# Patient Record
Sex: Female | Born: 1955 | Hispanic: Yes | Marital: Married
Health system: Western US, Academic
[De-identification: ages and names within clinical notes are randomized; demographics above are authoritative.]

---

## 2020-07-11 ENCOUNTER — Inpatient Hospital Stay: Admit: 2020-11-21 | Payer: MEDICARE | Primary: Physician

## 2020-11-06 ENCOUNTER — Inpatient Hospital Stay: Admit: 2020-11-21 | Payer: MEDICARE | Primary: Physician

## 2020-11-16 NOTE — Telephone Encounter (Signed)
Ambulatory Nursing Order for Surgical Pathology (Outside slide read)    Follow order inclusion criteria embedded within order template, once completed RN may enter order using Within Role/Scope - No Cosign Required     [place]  Date of Service: 10/27/2020   Accession No.:  [id number]   Number of Slides:  Path sample received:  Comments:

## 2020-11-16 NOTE — Telephone Encounter (Signed)
The following pathology has been received and needs a path overread order please:    Provider: Ovidio Hanger  Referral dx: Endometrial cancer     [place]  Date of Service: 10/27/2020   Accession No.:  [id number]   Number of Slides:  Path sample received:  Comments:

## 2020-11-17 NOTE — Telephone Encounter (Signed)
Slides received and brought down to path

## 2020-11-20 MED ORDER — LOSARTAN 50 MG TABLET
50 | ORAL | Status: AC
Start: 2020-11-20 — End: ?

## 2020-11-20 MED ORDER — ATORVASTATIN 20 MG TABLET
20 | ORAL | 3.00 refills | 90.00000 days | Status: DC
Start: 2020-11-20 — End: 2020-12-28

## 2020-11-22 ENCOUNTER — Inpatient Hospital Stay: Admit: 2020-11-22 | Payer: MEDICARE | Primary: Physician

## 2020-11-22 ENCOUNTER — Ambulatory Visit: Admit: 2020-11-22 | Discharge: 2020-12-13 | Payer: MEDICARE | Attending: Gynecologic Oncology | Primary: Physician

## 2020-11-22 DIAGNOSIS — C55 Malignant neoplasm of uterus, part unspecified (CMS code): Secondary

## 2020-11-22 DIAGNOSIS — R7303 Prediabetes: Secondary

## 2020-11-22 DIAGNOSIS — C541 Malignant neoplasm of endometrium: Secondary | ICD-10-CM

## 2020-11-22 LAB — PROTHROMBIN TIME
INR: 1 (ref 0.9–1.2)
PT: 12.8 s (ref 11.6–15.0)

## 2020-11-22 LAB — BASIC METABOLIC PANEL (NA, K, CL, CO2, BUN, CR, GLU, CA)
Anion Gap: 8 (ref 4–14)
Calcium, total, Serum / Plasma: 9.1 mg/dL (ref 8.4–10.5)
Carbon Dioxide, Total: 25 mmol/L (ref 22–29)
Chloride, Serum / Plasma: 106 mmol/L (ref 101–110)
Creatinine: 0.66 mg/dL (ref 0.55–1.02)
Glucose, non-fasting: 94 mg/dL (ref 70–199)
Potassium, Serum / Plasma: 3.7 mmol/L (ref 3.5–5.0)
Sodium, Serum / Plasma: 139 mmol/L (ref 135–145)
Urea Nitrogen, Serum / Plasma: 10 mg/dL (ref 7–25)
eGFRcr: 97 mL/min/{1.73_m2} (ref >59)

## 2020-11-22 LAB — COMPLETE BLOOD COUNT WITH DIFFERENTIAL
Abs Basophils: 0.03 10*9/L (ref 0.00–0.10)
Abs Eosinophils: 0.13 10*9/L (ref 0.00–0.40)
Abs Imm Granulocytes: 0.01 10*9/L (ref <0.10)
Abs Lymphocytes: 3.15 10*9/L (ref 1.00–3.40)
Abs Monocytes: 0.33 10*9/L (ref 0.20–0.80)
Abs Neutrophils: 2.72 10*9/L (ref 1.80–6.80)
Hematocrit: 40.3 % (ref 36.0–46.0)
Hemoglobin: 13 g/dL (ref 12.0–15.5)
MCH: 27.4 pg (ref 26.0–34.0)
MCHC: 32.3 g/dL (ref 31.0–36.0)
MCV: 85 fL (ref 80–100)
Platelet Count: 241 10*9/L (ref 140–450)
RBC Count: 4.74 10*12/L (ref 4.00–5.20)
WBC Count: 6.4 10*9/L (ref 3.4–10.0)

## 2020-11-22 LAB — HEMOGLOBIN A1C: Hemoglobin A1c: 5.5 %{total} (ref 4.3–5.6)

## 2020-11-22 LAB — ACTIVATED PARTIAL THROMBOPLASTIN TIME: aPTT: 28.5 s (ref 21.6–30.8)

## 2020-11-22 MED ORDER — CHLORTHALIDONE 25 MG TABLET
25 | ORAL | Status: AC
Start: 2020-11-22 — End: ?

## 2020-11-22 NOTE — Progress Notes (Incomplete)
[facility] - New Patient    Referring Physician  Rowan Blase, Md  [address]  [place] [address]     Visit Type: Initial Outpatient Consultation    Subjective:      Patient Identification:  Jacqueline Mclaughlin is a 65 y.o. 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     History of Present Illness  8/21:   - First episode of PMB. TVUS and pap smear normal.   -TVUS: Normal endometrial thickness of 3 mm. Several intramural fibroids, largest 3.0 cm in the fundus. 1.4 cm simple right ovarian cyst.    12/21:   - TVUS and EMB normal    07/11/20:  - Presents to ED for intermittent abdominal pain and spotting since August 2021  -TVUS: subcentimeter lesion in cervical canal, endometrial thickness 6 mm, 2 small fibroids  -CEA is 0.5. CA125 is 16. Plactin normal.    07/19/20:   - Referred to [facility] for continued PMB and pelvic pain. Continued bleeding since August (lasts up to 5 days, light to medium flow, cramping). Found to have a cervical lesion on exam. EMB performed.    07/21/20:   - Endometrial biopsy: Extremely scant benign fragmented surface endometrial glandular epithelium. Benign endocervical glandular tissue with microglandular hyperplasia. No hyperplasia, dysplasia or malignancy.  - Endocervix, biopsy: Benign endocervical glandular tissue. Benign ectocervical squamous epithelium. No dysplasia    09/12/20:   - Continued intermittent bleeding. PCP recommended repeat US. Patient sought GYN input. Scheduled for Pelvic exam under anesthesia, cervical dilation, hysteroscopy, endocervical and endometrial curettage, possible hysteroscopic polypectomy and/or myomectomy    10/27/20:  - Pelvic EUA,  hysteroscopy D&C, polypectomy   - Uterus, endometrium, curettage:  Endometrioid adenocarcinoma, FIGO grade 1-2. Sections of the endometrium curettage and polypectomy demonstrate an adenocarcinoma with tubular, cribriform and focal solid (5-10%) growth pattern. The tumor cells show moderate pleomorphism with enlarged hyperchromatic nuclei and small to medium amount of eosinophilic cytoplasm. Fragments of benign endometrial polyps are also noted. Immunostains are performed on tissue block A2 as follows: P53: Wild-type pattern, ER: Positive, diffuse and strong, Napsin A: Mostly negative.     MLH1 (M1): Positive, intact nuclear staining  PMS2 (A16-4): Positive, intact nuclear staining  MSH2 (I144-3154): Negative, loss of nuclear staining  MSH6 (MG86): Positive, intact nuclear staining    Loss of nuclear staining in MSH2 only. This pattern is unusual, and may represent heterogeneity of tumor in the limited biopsy material. Repeat mismatch repair proteins testing in final excision is recommended to further rule out possible Lynch syndrome.    - Uterus, endocervix, curettage: mostly mucoid material. Scant benign endocervical glands.    11/06/20: [place] CT of chest abdomen pelvis w/ contrast, s/p hysteroscopy:  Unremarkable chest CT.Myomatous uterus with interval reduction in the size of the endometrial cavity. 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 [name]. Referred for ***    *** significant abdominopelvic pain. *** tolerating diet PO without n/v, denies early satiety. Denies bloating, weight loss. ***normal bowel and bladder habits. Denies dysuria, hematuria. Denies current abnormal vaginal discharge/bleeding ***.     Denies family history  of breast cancer, uterine cancer, ovarian cancer, cervical cancer, or colon cancer.***     In terms of her surgical candidacy, she has PMH  of HTN, HLD, pre-diabetes, GERD, hematuria, post-partum cardiomyopathy. Past surgical h/o of a cesarean section X2, ovarian cyst removal, and a tubal ligation. BMI 31.25. She lives independently *** walker/ wheelchair    Gyn history***  - Menarche:   - Menses:   - Menopause: ~66 yo  - Sexually active and partners:   - Birth control: none  - H/o STIs: none     Health care maintenance***  - Pap: 02/02/2020, NILM, HPV negative  - Mammogram: 10/03/2020, normal  - Colonoscopy:  - DEXA: never    Review of Systems  ROS    Past Medical History  Past Medical History:   Diagnosis Date    Bilateral presbyopia     Cervical polyp     Cortical cataract of both eyes     Depression     Essential hypertension     GERD (gastroesophageal reflux disease)     Hematuria     Hyperlipidemia     Postpartum cardiomyopathy     Pre-diabetes     Tension headache     Thyroid nodule        OB History     Gravida   7    Para   6    Term   5    Preterm   1    AB   1    Living   5       SAB        IAB        Ectopic        Molar        Multiple        Live Births                    Past Surgical History  Past Surgical History:   Procedure Laterality Date    CESAREAN SECTION  1990    CESAREAN SECTION  2001    HYSTEROSCOPY  10/27/2020    OVARIAN CYST REMOVAL      THYROID BIOPSY  11/13/2015    TUBAL LIGATION          Medications  Current Outpatient Medications   Medication Sig    atorvastatin (LIPITOR) 20 mg tablet Take 20 mg by mouth daily    losartan (COZAAR) 50 mg tablet Take 50 mg by mouth daily     No current facility-administered medications for this visit.        Allergies  Patient has no allergy information on record.     Family History  No family history on file.     Social History  Social History     Socioeconomic History    Marital status: Unknown/Declined   Tobacco Use    Smoking status: Never Smoker          Objective:      There were no vitals taken for this visit.     No flowsheet data found.    Physical  Exam  Physical Exam        Studies  Labs:   ***    No results found for: WBC, RBC, HGB, HCT, MCV, MCH, MCHC, PLT, CBCD  No results found for: NA, K, CL, CO2, BUN, CREAT, GLU  No results found for: ALT, AST, ALKP, DBILI, TBILI, ANA4, TBILWB, GGT, GGTEXL  No results found for:  AFETOPROTEIN, AFP, AFPTEXL, AFPTEXQ, C125, CA125EXT, C125EXL, C125EXQ, CA15, CA19, LABCA2, CA27EXL, CA27EXQ, HCGT, CEA, CEAEXL, CEAEXQ, CEAEXT, PSA, PSAS, PSAEXT, PSATOT, PSAU, INHNB    Imaging:   ***    Pathology:  ***      Assessment/Plan:       Ms. Jacqueline Mclaughlin is a 65 y.o. [phone] with ***    # ***  ***       The patient was seen and evaluated with Dr. [name] (attending physician) who led the discussion and formulated the plan.    Lauro Franklin, MD, (PGY-1)  Obstetrics, Gynecology & [facility] [place], [place]  11/20/20

## 2020-11-22 NOTE — H&P (Cosign Needed)
Gynecology Oncology  New Patient    Referring Physician  Rowan Blase, Md  [address]  [place] [address]     Subjective:      Patient Identification:  Jacqueline Mclaughlin is a 65 y.o. 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     History of Present Illness  8/21:   - First episode of PMB. TVUS and pap smear normal.   -TVUS: Normal endometrial thickness of 3 mm. Several intramural fibroids, largest 3.0 cm in the fundus. 1.4 cm simple right ovarian cyst.    12/21:   - TVUS and EMB normal    07/11/20:  - Presents to ED for intermittent abdominal pain and spotting since August 2021  -TVUS: subcentimeter lesion in cervical canal, endometrial thickness 6 mm, 2 small fibroids  -CEA is 0.5. CA125 is 16. Plactin normal.    07/19/20:   - Referred to [place] for continued PMB and pelvic pain. Continued bleeding since August (lasts up to 5 days, light to medium flow, cramping). Found to have a cervical lesion on exam. EMB performed.    07/21/20:   - Endometrial biopsy: Extremely scant benign fragmented surface endometrial glandular epithelium. Benign endocervical glandular tissue with microglandular hyperplasia. No hyperplasia, dysplasia or malignancy.  - Endocervix, biopsy: Benign endocervical glandular tissue. Benign ectocervical squamous epithelium. No dysplasia    09/12/20:   - Continued intermittent bleeding. PCP recommended repeat US. Patient sought GYN input. Scheduled for Pelvic exam under anesthesia, cervical dilation, hysteroscopy, endocervical and endometrial curettage, possible hysteroscopic polypectomy and/or myomectomy    10/27/20:  - Pelvic EUA,  hysteroscopy D&C, polypectomy   - Uterus, endometrium, curettage: Endometrioid adenocarcinoma, FIGO grade 1-2. Sections of  the endometrium curettage and polypectomy demonstrate an adenocarcinoma with tubular, cribriform and focal solid (5-10%) growth pattern. The tumor cells show moderate pleomorphism with enlarged hyperchromatic nuclei and small to medium amount of eosinophilic cytoplasm. Fragments of benign endometrial polyps are also noted. Immunostains are performed on tissue block A2 as follows: P53: Wild-type pattern, ER: Positive, diffuse and strong, Napsin A: Mostly negative.     MLH1 (M1): Positive, intact nuclear staining  PMS2 (A16-4): Positive, intact nuclear staining  MSH2 (X211-9417): Negative, loss of nuclear staining  MSH6 (EY81): Positive, intact nuclear staining    Loss of nuclear staining in MSH2 only. This pattern is unusual, and may represent heterogeneity of tumor in the limited biopsy material. Repeat mismatch repair proteins testing in final excision is recommended to further rule out possible Lynch syndrome.    - Uterus, endocervix, curettage: mostly mucoid material. Scant benign endocervical glands.    11/06/20: [place] CT of chest abdomen pelvis w/ contrast, s/p hysteroscopy:  Unremarkable chest CT.Myomatous uterus with interval reduction in the size of the endometrial cavity. No abnormal lymphadenopathy or peritoneal spread identified.Colonic diverticulosis    Today 11/22/20, patient presents for initial consultation with Dr. [name].     No more bleeding since 10/27/20 procedure. Denies significant abdominopelvic pain. Has lost some weight but intentional (weight 181lb in June). Having urinary urgency which is relatively new. No urinary frequency, dysuria, hematuria. Normal bowel habits.      Denies family history of breast cancer, uterine cancer, ovarian cancer, cervical cancer, or colon cancer. Mother stomach cancer,  maternal aunt ?pancreatic cancer.    In terms of her surgical candidacy, she has PMH of HTN, HLD, pre-diabetes, GERD, postpartum cardiomyopathy.  Past surgical h/o of a cesarean section x2 (1 midline incision and 1 low transverse incision), ?open ovarian cyst removal (unsure which side), postpartum tubal ligation. BMI 31.25. She lives with daughter in [place] (daughter is Therapist, sports). Ambulates and can walk up multiple flights of stairs without issue.    Ob/Gyn history  - Menopause: ~65 yo  - NSVD x3, C/S x2    Health care maintenance  - Pap:02/02/2020, NILM, HPV negative  - Mammogram: 10/03/2020, normal  - Colonoscopy: never (no FIT testing either)  - DEXA: never      Medical History     Diagnosis Date Comment Source    Bilateral presbyopia       Cervical polyp       Cortical cataract of both eyes       Depression       Essential hypertension       GERD (gastroesophageal reflux disease)       Hematuria       Hyperlipidemia       Postpartum cardiomyopathy       Pre-diabetes       Tension headache       Thyroid nodule             Surgical History     Procedure Laterality Date Comment Source    CESAREAN SECTION  1990      CESAREAN SECTION  2001      HYSTEROSCOPY  10/27/2020      OVARIAN CYST REMOVAL        THYROID BIOPSY  11/13/2015      TUBAL LIGATION                OB History     Gravida   7    Para   6    Term   5    Preterm   1    AB   1    Living   5       SAB        IAB        Ectopic        Molar        Multiple        Live Births                      Current Outpatient Medications:     atorvastatin (LIPITOR) 20 mg tablet, Take 20 mg by mouth daily, Disp: , Rfl:     chlorthalidone (HYGROTON) 25 mg tablet, Take 25 mg by mouth daily, Disp: , Rfl:     losartan (COZAAR) 50 mg tablet, Take 50 mg by mouth daily, Disp: , Rfl:     Allergies as of 11/22/2020  Review status set to Review Complete by Clent Ridges on 11/22/2020      Severity Noted Reaction Type Reactions    Enalapril High 10/23/2020    Cough          No family history on file.    Social History     Tobacco Use    Smoking status: Never Smoker    Smokeless tobacco: Not on file   Substance Use  Topics    Alcohol use: Not on file         Objective:     Vitals    11/22/20 7408  BP: (!) 143/51   Pulse: 70   Resp: 16   Temp: 36.4 C (97.5 F)   TempSrc: Temporal   SpO2: 99%   Weight: 75.3 kg (166 lb)   Height: 153.5 cm (5' 0.43")   PainSc:   2   PainLoc: Abdomen     BP (!) 143/51 Comment: pt reports no abnormal symptoms  Pulse 70   Temp 36.4 C (97.5 F) (Temporal)   Resp 16   Ht 153.5 cm (5' 0.43") Comment: 8/22 @ [facility] W.PT  Wt 75.3 kg (166 lb)   SpO2 99%   BMI 31.96 kg/m   Body mass index is 31.96 kg/m.      Physical Exam  Constitutional:       General: She is not in acute distress.     Appearance: Normal appearance.   Genitourinary:      Genitourinary Comments: Atrophic vaginal mucosa, normal appearing cervix without blood in vault. Bimanual and rectovaginal exam with normal sized uterus without any palpable masses or lesions.    Cardiovascular:      Rate and Rhythm: Normal rate.   Pulmonary:      Effort: Pulmonary effort is normal.   Abdominal:      General: There is no distension.      Palpations: Abdomen is soft.      Tenderness: There is no abdominal tenderness.   Musculoskeletal:      Cervical back: Neck supple.   Lymphadenopathy:      Cervical: No cervical adenopathy.   Neurological:      General: No focal deficit present.      Mental Status: She is alert and oriented to person, place, and time.   Skin:     General: Skin is warm and dry.   Psychiatric:         Mood and Affect: Mood normal.         Behavior: Behavior normal.           Labs:   Will obtain pre-op labs    Imaging:  CT C/A/P 11/06/20              Assessment:       Jacqueline Mclaughlin is a 65 y.o. [phone] with postmenopausal bleeding found to have FIGO grade 1-2 endometrioid adenocarcinoma of the uterus.       Plan:       -Stay one night after surgery (lives in [place])  -Will need post-op lovenox x14d  -OK for EUA with medical student  -Pre-op orders placed, including pre-op labs    We discussed that she is a candidate for a  minimally invasive procedure given uterine size and no evidence of distant disease. We discussed that minimally invasive surgery provides benefits including shorter time to recovery and fewer perioperative complications. However, if the surgery cannot be safely completed with a minimally invasive approach, we may have to convert to an open procedure although such conversions are rare.  As such, she was consented for a robotic assisted laparoscopic removal of uterus, cervix, ovaries, fallopian tubes, sentinel lymph node mapping and dissection, removal of abnormal tissue, exam under anesthesia, possible open procedure.    I have discussed with her the rationale, risks, benefits, and alternatives to the above named procedure. We discussed specific risks including the risks of infection, bleeding, possible transfusion with associated infectious and other risks.  We discussed the possible need for a blood transfusion with the concomitant risk of infection with HIV or hepatitis  C. She accepts blood transfusion if needed.    We discussed the risk of injury to the bowel, bladder, ureters, blood vessels, nerves, or other surrounding structures, which is usually identified and corrected at the time of the initial surgery, though could result in the need for additional procedures or surgeries during the same or a subsequent hospitalization. The risks of wound separation or infection necessitating packing which could take weeks to months were reviewed. We also discussed the risk of lymphedema which may be permanent in up to 10% of patients. We discussed the risk of a life threatening perioperative event such as a heart attack, stroke, thromboembolic event, or complication resulting in death. The patient and her family were reassured that we would do our best to minimize these risks. Surgical and hysterectomy consents were signed by the patient and witnessed.  Preoperative instructions were provided and all questions were  answered to their satisfaction.    Seen and discussed with Dr. [name], gyn/onc attending  Carlynn Herald MD, PGY-4  Resident Physician  Department of Obstetrics, Gynecology & Reproductive Sciences  11/22/2020

## 2020-12-01 NOTE — Telephone Encounter (Signed)
LVM for daughter to go over surgery details.

## 2020-12-01 NOTE — Telephone Encounter (Signed)
Call from patient asking to get more information about her surgery date as she needs to make sure her daughter will be available as well. Informed patient I will send message to Dr [name] [facility].  Spanish interp [place] id# [id number]

## 2020-12-22 NOTE — Telephone Encounter (Signed)
Called pt gave sx info and pre/post appointments. Interpreter [place] ID# [phone]

## 2020-12-28 ENCOUNTER — Ambulatory Visit: Payer: MEDICARE | Primary: Physician

## 2020-12-28 DIAGNOSIS — Z01818 Encounter for other preprocedural examination: Secondary

## 2020-12-28 MED ORDER — ATORVASTATIN 10 MG TABLET
10 | Freq: Every day | ORAL | 3.00 refills | 90.00000 days | Status: AC
Start: 2020-12-28 — End: ?

## 2020-12-28 MED ORDER — CHOLECALCIFEROL (VITAMIN D3) 25 MCG (1,000 UNIT) TABLET
1000 | Freq: Every day | ORAL | Status: AC
Start: 2020-12-28 — End: ?

## 2020-12-28 MED ORDER — OMEGA-3 FATTY ACIDS 1,000 MG CAPSULE
1000 | ORAL | Status: AC
Start: 2020-12-28 — End: ?

## 2020-12-28 NOTE — H&P (View-Only) (Signed)
[facility]  Anesthesia Preprocedure Evaluation    Procedure Information     Case: [id number] Date/Time: 01/05/21 0730    Procedure: XI ROBOTIC ASSISTED TOTAL HYSTERECTOMY;BILATERAL SALPINGO-OOPHORECTOMY WITH LYMPH NODE DISSECTION (N/A Uterus)    Diagnosis: Endometrial adenocarcinoma (CMS code) [C54.1]    Pre-op diagnosis: Endometrial adenocarcinoma (CMS code) [C54.1]    Location: [facility] OR 09 / [facility]    Surgeons: Wilma Flavin, MD          Precautions          None          Relevant Problems   No relevant active problems         Anesthesia Encounter History        CC/HPI/Past Medical History Summary: Katey Barrie is a 65 y.o. female with endometrial cancer s/f XI ROBOTIC ASSISTED TOTAL HYSTERECTOMY;BILATERAL SALPINGO-OOPHORECTOMY WITH LYMPH NODE DISSECTION (N/A Uterus) 01/05/21       (Please refer to APeX Allergies, Problems, Past Medical History, Past Surgical History, Social History, and Family History activities, Results for current data from these respective sections of the chart; these sections of the chart are also summarized in reports, including the Patient Summary Extracts found in Chart Review)      Summary of Outside Records:  ~~~~~~~~~~~~~~~~~~~~~~~~    Labs 01/03/21 in [place].    Notable for K 3.3     11/22/2020 10:58  Sodium: 139  Potassium: 3.7  Chloride: 106  CO2: 25  Anion Gap: 8  Glucose: 94  Urea Nitrogen, Serum / Plasma: 10  Creatinine: 0.66  eGFRcr: 97  Calcium: 9.1  WBC Count: 6.4  Hemoglobin: 13.0  Hematocrit: 40.3  RBC Count: 4.74  Platelet Count: 241  MCV: 85  MCH: 27.4  MCHC: 32.3  Abs Neutrophils: 2.72  Abs Lymphocytes: 3.15  Abs Monocytes: 0.33  Abs Eosinophils: 0.13  Abs Basophils: 0.03  Abs Imm Granulocytes: 0.01  PT: 12.8  INR: 1.0  aPTT: 28.5  Hemoglobin A1c: 5.5    ~~~~~~~~~~~~~~~~~~~~~~~~  Summary of Prior Anesthetics: Previous anesthetic with [facility]  Pt reports remote hx of prolonged sedation        Allergies as of 12/28/2020  Review status set to  Review Complete by Angelique Blonder, NP on 12/28/2020      Severity Noted Reaction Type Reactions    Enalapril High 10/23/2020    Cough        Prescription Medications as of 12/28/2020       Rx Number Disp Refills Start End Last Dispensed Date Next Fill Date [place]    atorvastatin (LIPITOR) 10 mg tablet        [place], [place]    Sig: Take 10 mg by mouth daily    Class: Historical Med    Route: Oral    chlorthalidone (HYGROTON) 25 mg tablet    07/24/2020    [place], [place]    Sig: Take 25 mg by mouth every morning    Class: Historical Med    Route: Oral    cholecalciferol, vitamin D3, 1000 UNITS tablet        [place]    Sig: Take 1,000 Units by mouth daily    Class: Historical Med    Route: Oral    losartan (COZAAR) 50 mg tablet  Sig: Take 50 mg by mouth every evening    Class: Historical Med    Route: Oral    omega-3 fatty acids 1,000 mg capsule        [place], [place]: Take 2 g by mouth daily    Class: Historical Med    Route: Oral            Review of Systems Functional Status: Climb a flight of stairs or walk up a hill (5.50 METs)   Constitutional: Negative for chills, fever and unexpected weight change.   Airway: Positive for dental hardware (permanent bridges ) and snoring. Negative for limited neck movement, difficulty opening mouth, loose teeth, witnessed apnea and CPAP  Respiratory: Negative for cough, Recent URI Symptoms and shortness of breath.    Cardiovascular: Negative for leg swelling, orthopnea, palpitations and chest pain.   Gastrointestinal: Positive for GERD Symptoms (occasional diet related .  no meds ).        Denies liver disease      Genitourinary: Negative for dysuria.        Denies kidney disease   Denies UTI symptoms      Musculoskeletal: Negative.    Skin: Negative for rash and wound.   Neurological:  Negative for syncope and seizures.   Hematological: Does not bruise/bleed easily.        Denies bleeding or clotting abnormality   + blood transfusion 1984      Ht 152.4 cm (5')   Wt 74.8 kg (165 lb)   BMI 32.22 kg/m     Physical Exam    Prepare (Pre-Operative Clinic) Assessment/Plan/Narrative  Prepare Clinic consult type: Telephone  CHG needed for pre-surgical prep, pt given instructions via AVS.  AVS mailed to patient     Interpreter: Available -  Name and/or ID: [id number]  Interpreter Source: Telephone Interpreter  Language: Spanish    T&S and K ordered STAT in pre-op       Obstructive Sleep Apnea Screening  STOPBANG Score:      S Do you Snore loudly (louder than talking or loud  enough to be heard through closed doors)? No    T Do you often feel Tired, fatigued, or sleepy during daytime? No    O Has anyone Observed you stop breathing during  your sleep? No    P Do you have or are you being treated for high blood Pressure? Yes    B BMI more than 35 KG/m^2? No    A Age over 53 years old? Yes    N Neck circumference > 16 inches (40cm)?     G Gender: Female? No    STOPBANG Total Score 2         Risk Level (based only on STOPBANG) Low - Incomplete                     Anesthesia Assessment and Plan  ASA 2       Anesthesia Plan  Invasive Monitors/Vascular Access: none    (See Anesthesia Record for attending attestation)    [Please note, smart link data included in this note may not reflect changes since note creation. Please see appropriate section of APeX for up-to-the minute information.]

## 2020-12-28 NOTE — Anesthesia Pre-Procedure Evaluation (Addendum)
[facility]  Anesthesia Preprocedure Evaluation    Procedure Information     Case: [id number] Date/Time: 01/05/21 0730    Procedure: XI ROBOTIC ASSISTED TOTAL HYSTERECTOMY;BILATERAL SALPINGO-OOPHORECTOMY WITH LYMPH NODE DISSECTION (N/A Uterus)    Diagnosis: Endometrial adenocarcinoma (CMS code) [C54.1]    Pre-op diagnosis: Endometrial adenocarcinoma (CMS code) [C54.1]    Location: [facility] OR 09 / [facility]    Surgeons: Wilma Flavin, MD          Precautions          None          Relevant Problems   No relevant active problems         Anesthesia Encounter History        CC/HPI/Past Medical History Summary: Jacqueline Mclaughlin is a 65 y.o. female with endometrial cancer s/f XI ROBOTIC ASSISTED TOTAL HYSTERECTOMY;BILATERAL SALPINGO-OOPHORECTOMY WITH LYMPH NODE DISSECTION (N/A Uterus) 01/05/21.    NPO confirmed    Cardiac function and exercise tolerance unclear - does chores around the house, takes walks outside. Patient says her heart feels "racing" after climbing stairs quickly, but denies light headedness or syncope/near-syncope. No LE edema. Occasionally feels short of breath when lying flat but relieved when turning on her side. Sleeps on 1-2 pillows.      (Please refer to APeX Allergies, Problems, Past Medical History, Past Surgical History, Social History, and Family History activities, Results for current data from these respective sections of the chart; these sections of the chart are also summarized in reports, including the Patient Summary Extracts found in Chart Review)      Summary of Outside Records:  ~~~~~~~~~~~~~~~~~~~~~~~~    Labs 01/03/21 in [place].    Notable for K 3.3     11/22/2020 10:58  Sodium: 139  Potassium: 3.7  Chloride: 106  CO2: 25  Anion Gap: 8  Glucose: 94  Urea Nitrogen, Serum / Plasma: 10  Creatinine: 0.66  eGFRcr: 97  Calcium: 9.1  WBC Count: 6.4  Hemoglobin: 13.0  Hematocrit: 40.3  RBC Count: 4.74  Platelet Count: 241  MCV: 85  MCH: 27.4  MCHC: 32.3  Abs  Neutrophils: 2.72  Abs Lymphocytes: 3.15  Abs Monocytes: 0.33  Abs Eosinophils: 0.13  Abs Basophils: 0.03  Abs Imm Granulocytes: 0.01  PT: 12.8  INR: 1.0  aPTT: 28.5  Hemoglobin A1c: 5.5    ~~~~~~~~~~~~~~~~~~~~~~~~  Summary of Prior Anesthetics: Previous anesthetic with [facility]  Slow to wake up ~1984 after pelvic tumor surgery (a couple of hours) - informed patient this was within range of normal.       Allergies as of 12/28/2020  Review status set to Review Complete by Angelique Blonder, NP on 12/28/2020      Severity Noted Reaction Type Reactions    Enalapril High 10/23/2020    Cough        Prescription Medications as of 12/28/2020       Rx Number Disp Refills Start End Last Dispensed Date Next Fill Date [place]    atorvastatin (LIPITOR) 10 mg tablet        [place], [place]: Take 10 mg by mouth daily    Class: Historical Med    Route: Oral    chlorthalidone (HYGROTON) 25 mg tablet    07/24/2020    [place]    [place]: Take 25 mg by mouth every  morning    Class: Historical Med    Route: Oral    cholecalciferol, vitamin D3, 1000 UNITS tablet        [place], [place]: Take 1,000 Units by mouth daily    Class: Historical Med    Route: Oral    losartan (COZAAR) 50 mg tablet            [place]: Take 50 mg by mouth every evening    Class: Historical Med    Route: Oral    omega-3 fatty acids 1,000 mg capsule        [place], [place]: Take 2 g by mouth daily    Class: Historical Med    Route: Oral            Review of Systems Functional Status: Climb a flight of stairs or walk up a hill (5.50 METs)   Constitutional: Negative for chills, fever and unexpected weight change.   Airway: Positive for dental hardware (permanent bridges ) and snoring. Negative for limited neck movement, difficulty opening mouth, loose teeth, witnessed apnea and  CPAP  Respiratory: Negative for cough, Recent URI Symptoms and shortness of breath.    Cardiovascular: Negative for leg swelling, orthopnea, palpitations and chest pain.   Gastrointestinal: Positive for GERD Symptoms (occasional diet related .  no meds ).        Denies liver disease      Genitourinary: Negative for dysuria.        Denies kidney disease   Denies UTI symptoms      Musculoskeletal: Negative.    Skin: Negative for rash and wound.   Neurological: Negative for syncope and seizures.   Hematological: Does not bruise/bleed easily.        Denies bleeding or clotting abnormality   + blood transfusion 1984      Ht 152.4 cm (5')   Wt 74.8 kg (165 lb)   BMI 32.22 kg/m     Physical Exam    Airway:   Modified Mallampati score: I. Thyromental distance: 4 cm - 6.5 cm. Mouth opening: good. Neck range of motion: full.   Constitutional:       General: She is not in acute distress.     Appearance: She is well-developed and well-nourished.   HENT:      Mouth/Throat:      Mouth: Oropharynx is clear and moist.      Dentition: Normal.     Eyes:      Conjunctiva/sclera: Conjunctivae normal.   Cardiovascular:      Rate and Rhythm: Normal rate and regular rhythm.      Pulses: Intact distal pulses.      Heart sounds: Normal heart sounds.   Pulmonary:      Effort: Pulmonary effort is normal.      Breath sounds: Normal breath sounds.   Musculoskeletal:      Cervical back: Normal range of motion. Neurological:   Level of consciousness: alert.      Dental: dentition is normal.          Prepare (Pre-Operative Clinic) Assessment/Plan/Narrative  Prepare Clinic consult type: Telephone  CHG needed for pre-surgical prep, pt given instructions via AVS.  AVS mailed to patient     Interpreter: Available -  Name and/or ID: [phone]  Interpreter Source: Telephone Interpreter  Language: Spanish    T&S and K  ordered STAT in pre-op       Obstructive Sleep Apnea Screening  STOPBANG Score:      S Do you Snore loudly (louder than talking or  loud  enough to be heard through closed doors)? No    T Do you often feel Tired, fatigued, or sleepy during daytime? No    O Has anyone Observed you stop breathing during  your sleep? No    P Do you have or are you being treated for high blood Pressure? Yes    B BMI more than 35 KG/m^2? No    A Age over 33 years old? Yes    N Neck circumference > 16 inches (40cm)?     G Gender: Female? No    STOPBANG Total Score 2         Risk Level (based only on STOPBANG) Low - Incomplete                     Anesthesia Assessment and Plan  Day Of Surgery Provider Chart Review:  NPO status verified  Medications reviewed  Allergies reviewed  Problem list reviewed  Anesthesia history reviewed  Pertinent labs reviewed  Consults reviewed    ASA 2       Anesthesia Plan  Anesthesia Type: general  Pre-medication: gabapentin  Induction Technique: IntraVenous  Invasive Monitors/Vascular Access: none  Airway Plan: oral ET tube  Other Techniques: processed EEG monitoring  Planned Recovery Location: PACU    Blood Product PreparationBlood Products Plan: N/A, minimal risk    Anesthesia Potential Complication Discussion  There is the possibility of rare but serious complications.    Informed Consent for Anesthesia  Consent obtained from patient    Risks, benefits and alternatives including those of invasive monitoring discussed. Increased risks (as above) discussed.  Questions invited and all answered.  Interpreter: Interpreter information below  Pauline Aus [phone] via computer  Consent granted for anesthetic plan    Quality Measure Documentation   Opioid Therapy Planned? Yes    (See Anesthesia Record for attending attestation)    [Please note, smart link data included in this note may not reflect changes since note creation. Please see appropriate section of APeX for up-to-the minute information.]

## 2020-12-28 NOTE — Patient Instructions (Signed)
Queremos que su prxima visita quirrgica sea lo ms segura y cmoda posible. Las siguientes instrucciones estn diseadas con el fin de prepararle para su hospitalizacin [place]. Por favor, lea y [place] instrucciones minuciosamente.    Lugar del procedimiento  Su procedimiento est programado para llevarse a cabo en [place], [place] 2nd floor Adult surgical waiting Room [phone]    [place] y Mellody Drown del procedimiento  Por favor, llegue a las 01/05/2021  at 5:30 am .     Your surgery is scheduled to start at 7:30 however you will need to arrive (Su ciruga est programada para comenzar a las 7:30 . Sin embargo, Warden/ranger a las ) 2 hours in advance of the scheduled surgical start time (2 horas antes de la hora programada para el inicio de la ciruga) in order to allow ample time for pre-surgical preparation (a fin de permitir que haya el tiempo suficiente para la preparacin prequirrgica).    Si por algn motivo se cambia el tiempo del inicio de su [place], usted recibir una llamada del consultorio se su cirujano. Si tiene alguna pregunta con respecto a la fecha o la hora de llegada, por favor llame al consultorio de su cirujano y pida hablar con su asistente.    Preparacin para la ciruga  Cundo debo dejar de comer?  Por favor, no coma ni beba nada despus de la medianoche, la noche antes de su ciruga (incluidos chicle, dulce o mentas).  Sin embargo, puede beber pequeos sorbos de agua, si se le indica que tome medicamentos, la maana de la [place].  Si come o bebe algo aparte de los pequeos sorbos de agua despus de la medianoche, la noche antes de su [place], esta se cancelar.      Qu medicamentos debo Administrator, arts de la ciruga?     Medication List            Accurate as of December 28, 2020  4:25 PM. Always use your most recent med list.                Informacin sobre [place] de la ciruga Instrucciones especiales  Other   atorvastatin 10 mg tablet  Take 10 mg by mouth daily  Commonly known as: LIPITOR        Continue       chlorthalidone 25 mg tablet  Take 25 mg by mouth every morning  Commonly known as: HYGROTON     Do not take morning of procedure           cholecalciferol (vitamin D3) 1000 UNITS tablet  Take 1,000 Units by mouth daily     Do not take morning of procedure           losartan 50 mg tablet  Take 50 mg by mouth every evening  Commonly known as: COZAAR     Do not take morning of procedure           omega-3 fatty acids 1,000 mg capsule  Take 2 g by mouth daily        Continue              Avoid Aspirin, Advil, Motrin, Ibuprofen, Aleve, Naproxen, Excedrin, meloxicam (Mobic), diclofenac (Voltaren), celecoxib (Celebrex), Fish Oil, Omega 3, glucosamine/chondroitin, and Vitamin E, ginkgo biloba, garlic, turmeric/curcumin, herbal medicines, and flax seeds oil seven days before surgery to prevent prolonged bleeding.  You  may take acetaminophen (Tylenol) if needed for pain        Preparacin para acudir al hospital  Por favor, dchese o bese la noche antes o la maana de la [place], ya sea con jabn antibacteriano Dial o con jabn de clorhexidina (Hibiclens). Por favor, lvese el cuerpo entero usando uno de estos dos jabones. Sin embargo, no aplique [place] de clorhexidina (Hibiclens) al rostro, ya que este puede causar grave dao a los ojos. Puede comprar estos jabones en la mayora de las farmacias (por [place], [place], [employer], [employer], [employer]).    Pngase ropa informal cmoda y [place], y deje en casa todas sus prendas de valor, incluidas joyas y altas sumas de [place].  Deje en casa los lentes de contacto. Pngase sus anteojos y traiga un estuche.  Si contrae alguna enfermedad antes de la [place] ([place], tos, garganta irritada, resfriado, gripe o infeccin), por favor llame a su cirujano y a los nmeros [facility] at [phone].  Si va a pasar la noche (en el  hospital) traiga sus artculos de higiene personal y ropa para dormir si lo desea. De lo contrario, el hospital se los Geneticist, molecular.   NO traiga sus medicamentos consigo al hospital, salvo que se le indique especficamente que lo haga.  TRAIGA una lista de sus medicamentos, incluidas las dosis y [employer] toma.  TRAIGA DOS formas de identificacin, incluida una identificacin con fotografa.    Al salir del hospital  Por favor, pregntele a su cirujano sobre su estada anticipada.  Se recomienda que todos los pacientes cuenten con una persona responsable en casa la primera noche despus de que se le d el alta del hospital.  TODOS los pacientes, incluidos los pacientes que se someten a [place] y se Lucianne Lei a [place], deben [employer] arreglos para que un adulto los transporte en auto/acompae a casa al darles el alta. Si estos arreglos no se hacen con anticipacin, a los pacientes que se irn a Engineer, agricultural de la operacin se les cancelar su procedimiento.    Familiares y amigos  Los familiares y amigos pueden Photographer en las reas de [place]. Los coordinadores de atencin a los pacientes estn disponibles en estas reas de espera de pacientes para proporcionarles informacin actualizada con respecto a la evolucin de los pacientes; las asignaciones a Ecologist hospital; y planificacin al dar el alta (para la [place]).    [place] 2nd floor Adult surgical waiting Room [phone]    We want your upcoming surgical visit to be as safe and comfortable as possible. The following instructions are designed to prepare you for your surgical hospitalization.  Please read and follow all instructions carefully.    Procedure Location  Your procedure is scheduled to take place at [facility], [place] 2nd floor Adult surgical waiting Room [phone]    Procedure Date and Time  Please arrive on 01/05/2021   at 5:30.       Your surgery is scheduled to start at 7:30 however you will need to arrive 2 hours in advance of the scheduled surgical start time in order to allow ample time for pre-surgical preparation.    If for any reason your surgery start time is changed, you will receive a call from your surgeons office.  Should you have any questions regarding the date or time of arrival, please  call your surgeons office and ask to speak with his/her practice assistant.    Preparing for Surgery  What can I eat or drink on the day of surgery?  Please do not have anything to eat or drink except clear liquids after midnight the evening before your surgery (including gum, candy or mints).  You may have clear liquids on the day of surgery up to 2 hours prior to arrival.  Clear liquids include:  Non-pulp, clear apple juice.  Tea with sugar or sweetener (NO milk, cream, or milk substitute)  Gatorade  Water  If you drink anything other than clear liquids on the day of surgery, or if you have ANYTHING to drink in the two hours prior to hospital arrival, then your doctors will cancel your surgery for your safety.  If you have been instructed to take any medications the day of surgery, take them with a small sip of water.    If your surgeon has given you additional instructions about what to eat or drink before the day of surgery, please follow those instructions as well.    Which medications should I take on the day of surgery?     Medication List            Accurate as of December 28, 2020  4:25 PM. Always use your most recent med list.                Medication Information        Take Morning of Surgery Special Instructions Other   atorvastatin 10 mg tablet  Take 10 mg by mouth daily  Commonly known as: LIPITOR        Continue       chlorthalidone 25 mg tablet  Take 25 mg by mouth every morning  Commonly known as: HYGROTON     Do not take morning of procedure           cholecalciferol (vitamin D3) 1000 UNITS tablet  Take 1,000 Units  by mouth daily     Do not take morning of procedure           losartan 50 mg tablet  Take 50 mg by mouth every evening  Commonly known as: COZAAR     Do not take morning of procedure           omega-3 fatty acids 1,000 mg capsule  Take 2 g by mouth daily      Stop now                Avoid Aspirin, Advil, Motrin, Ibuprofen, Aleve, Naproxen, Excedrin, meloxicam (Mobic), diclofenac (Voltaren), celecoxib (Celebrex), Fish Oil, Omega 3, glucosamine/chondroitin, and Vitamin E, ginkgo biloba, garlic, turmeric/curcumin, herbal medicines, and flax seeds oil seven days before surgery to prevent prolonged bleeding.  You may take acetaminophen (Tylenol) if needed for pain      Preparing to Come to the [facility] with Hibiclens (Chlorhexidine) soap or chlorhexidine cloth to prevent infections     Instructions:   You should shower with Hibiclens soap or chlorhexidine cloth at least two times before your surgery-on the night before surgery and the morning before your surgery.     How to shower with Hibiclens soap:   1.  Rinse your body with warm water.   2.  Wash your hair with regular shampoo.   Rinse your hair with water. If you are having neck surgery, use Hibiclens soap instead of your regular shampoo to wash your hair.  Rinse your hair with water.   3.  Wet a clean sponge. Turn off the water. Apply Hibiclens soap liberally.   4.  Firmly massage all areas: neck, arms, chest, back, abdomen, hips, groin, genitals (external only) and buttocks. Avoid using Hibiclens soap on your face. Clean your legs and feet and between your fingers and toes. Pay special attention to the site of your surgery and all surrounding skin. Ask for help to clean your back if you are having back surgery.   5.  Lather again before rinsing.   6.  Turn on the water and rinse Hibiclens off your body.   7.  Dry off with a clean towel.   8.  Don't apply lotions or powders.   9.  Use clean clothes and freshly laundered bed linens    How to wash with Ready  Prep CHG (Chlorhexidine Cloth)              1.  Rinse your body with warm water.         2.  Wash your hair with regular shampoo. Rinse your hair with water.        3.  Use one cloth to cleanse treatment area        4.  Vigorously scrub skin back and forth for three minutes        5.  Allow area to dry for one minute. Do not rinse        6.  Do not use on open wounds   7. See package details for more instructions    Important showering reminders:   Do not use any other soaps or body wash when using Hibiclens. Other soaps can block the Hibiclens benefits.   After showering, do not apply lotion, cream, powder, deodorant, or hair conditioner.   Do not shave or remove body hair. Shaving your face is generally fine. If you are having head surgery, however, ask your doctor whether you can shave.   Hibiclens is safe to use on minor wounds, rashes, burns, and over staples and stitches.   Allergic reactions are rare but may occur. If you have an allergic reaction, stop using Hibiclens and call your doctor if you have a skin irritation.   If you are allergic to Hibiclens, please follow the bathing instructions above using an over-the-counter regular soap instead of Hibiclens..   If your surgeon has given you additional instructions, please follow those.          Wear casual, loose fitting, comfortable clothing and leave all valuables, including jewelry, and large sums of cash at home.  Leave your valuables at home. Belongings that remain with you are your responsibility. [facility] is not liable for loss or damage. If valuables are brought to the hospital, they will be identified and recorded by our admitting team. All jewelry must be removed and left at home. If rings cannot be removed, we encourage you to have them removed by a jeweler prior to surgery to avoid damage or loss of ring.   is not responsible of lose or damaged jewelry. This policy protects the patient and prevents the items from being lost or damaged.   Leave  contact lenses at home. Wear your eyeglasses and bring a case.  If you develop any illness prior to surgery (fever, cough, sore throat, cold, flu, infection), OR START A NEW MEDICATION, please call your surgeon and the [facility] at [phone].  If you are spending the  night you may bring toiletries and sleeping clothes if you desire; otherwise the hospital will provide them for you.   DO NOT bring your medications with you to the hospital unless you were specifically instructed to do so.  DO bring a list of your medications including dose(s) and when you take them.  DO bring TWO forms of ID - including one ID with a photo.    Leaving the Hospital  Please ask your surgeon about your anticipated length of stay.   Please make sure your ride is available to pick you up by 12N on day of discharge  It is recommended that all patients have a responsible person at home the first night after discharge from the hospital.  ALL patients, including same day surgery patients, must arrange for an adult to drive/escort them home upon discharge.  Patients going home the same day of surgery will have their procedure cancelled if these arrangements are not made ahead of time.    Family and Friends  Friends and family may wait in the assigned waiting areas.  Patient care coordinators are available in these patient waiting areas to provide updates regarding patient progress; hospital room assignments; and discharge planning (for same day surgery).    [place], [place] 2nd floor Adult surgical waiting Room [phone]    Guided Imagery  Research has shown that listening to guided imagery is helpful for many health conditions.  [facility] offers these sessions to listen to at the following website: [email or web address].[email or web address]     Please consider guided imagery to prepare for surgery and for coping with stress, sleep and pain.      COVID Screening  On-Demand  Screening for COVID is required for all patients and visitors arriving to a [facility] facility. To expedite the screening process, you are welcome to utilize the [place] screening on-demand tool. Please follow the link from your smartphone or scan the QR code to open the screening tool on the day of your arrival and answer the screening questions prior to your arrival.     On-demand COVID screening: [email or web address]         Temporary Visitor Restrictions During the COVID-19 Pandemic:    Our Visitor Policies are guided by COVID-19 prevalence rate in the [employer] and in the [place]. We use three different levels of visitation. Before coming to the hospital for your surgery/procedure, please review the [place] Visitor Restrictions: [email or web address].[email or web address].  Visitor restrictions can vary by location, and may not be the same in Perioperative Care areas as they are in other parts of the hospital  If you are admitted to the hospital overnight visitors are approved by the inpatient Unit Director

## 2020-12-28 NOTE — Telephone Encounter (Signed)
Outpatient Oncology Social Work Note    Referral Reason: potential transportation needs    Data/Assessment:  SW referral received for potential transportation support. SW spoke with patient via phone with [place] interpreter [place] [id number]. Pt reported she was told that surgery on 10/7 was postponed to 10/17. SW informed patient team was attempting to schedule for 10/10. Pt reports her son is off of work on 10/7 and daughter is off on 10/17 given the information she received. Pt reports she has 2 options for transportation but just needs to inform them ahead of time given they work night shift. Pt agreeable to any date for surgery, but would like confirmation as soon as possible. SW empathized with patient re: multiple dates. SW to collaborate with PC Danae Chen re: surgery details. SW informed pt we would inform her of confirmed surgery details when they are known. Pt agreeable.     SW informed pt that she would need COVID test prior to surgery. Pt reported it is difficult to get one via PCP. SW informed pt likely  could schedule a test but transportation may be an issue. SW offered to screen patient for financial eligibility of CCS[employer] resources. Pt declined screening for CCSW resources at this time as she can arrange a ride with advanced notice with her family/friends. COVID test will need to be scheduled when surgery confirmed.     Pt disclosed feeling worried about surgery. SW provided empathy and support and inquired if she had questions for the team. Pt reported initially she was told she would not need chemotherapy if she had surgery. SW agreed to relay pt's question to medical team for them to address directly. SW sent msg to Dr. [name] RN team.     Pt agreeable to awaiting call from [facility] re: surgery scheudling. Patient has SW contact information for further follow up/needs. SW to remain available.     SW and PC [employer] discussed case. PC confirming surgery details and will reach out to pt directly re:  schedule.     Plan:  SW offered potential transportation resources; pt declined screening for [employer] resources.   Pt has 2 options for transportation with family/friends; needs advanced notice.  Pt with questions re: treatment plan; SW relayed to RN team.   SW provided pt with contact information. SW remains available for support or follow up needs.     Loleta Chance, [place], MPH 12/28/20  [phone]

## 2021-01-01 ENCOUNTER — Ambulatory Visit: Payer: MEDICARE | Attending: Registered Nurse | Primary: Physician

## 2021-01-01 DIAGNOSIS — Z719 Counseling, unspecified: Secondary

## 2021-01-01 NOTE — Patient Instructions (Addendum)
Verdis Frederickson,     Thank you for speaking with me today. Please see below the surgery items we went over.     [place],Rn

## 2021-01-01 NOTE — Progress Notes (Addendum)
Pre-Operative Education Tool Clinical biochemist) Patient Education:    Referral To:   No referral required   Education pamphlets given and explained: POET Minimally Invasive Gynecologic Oncology Surgery: A Patient's Guide    Post-op medications explained: Oxycodone, Tylenol, Ibuprofen, Lovenox and Senna    Necessary pre-op labs and tests explained and ordered.    Patient / Family Responses to Teaching:  Yes     Verbalizes understanding of information / instructions given: (if no, document action plan)   Yes     Other topics discussed:  Patient inquired if chemotherapy is needed after surgery.  Pt to get Covid test 01/02/21  [facility] [address]   P: [phone]    F: [phone]     Length of teaching: 1hr and 15 min       Reviewed with pt and/or family how to contact the office as needed for surgery scheduling and or clinical questions and concerns.

## 2021-01-03 NOTE — Telephone Encounter (Addendum)
TC to patient re: plan. LVM to call clinic back/     TC to patient /dtr FH:QRFX. Discussed with dtr about care plan and that this would have to wait for pathology results after surgery then Dr. [name] will discuss with patient next steps.     Dtr shared that patient attempted to get lab drawn prior to surgery but was unable to because there was no lab orders. Caller will reach to provider again.

## 2021-01-05 ENCOUNTER — Inpatient Hospital Stay
Admit: 2021-01-05 | Discharge: 2021-01-06 | Disposition: A | Discharge status: Home / Self Care | Payer: MEDICARE | Attending: Gynecologic Oncology | Admitting: Gynecologic Oncology

## 2021-01-05 DIAGNOSIS — C541 Malignant neoplasm of endometrium: Secondary | ICD-10-CM

## 2021-01-05 LAB — POTASSIUM, SERUM / PLASMA: Potassium, Serum / Plasma: 3.3 mmol/L — ABNORMAL LOW (ref 3.5–5.0)

## 2021-01-05 LAB — BLOOD TYPE CONFIRMATION (REQUIRES SEPARATE PHLEBOTOMY): ABO/RH(D): O POS

## 2021-01-05 LAB — TYPE AND SCREEN
ABO/RH(D): O POS
Antibody Screen: NEGATIVE

## 2021-01-05 MED ORDER — SENNOSIDES 8.6 MG TABLET
8.6 | ORAL_TABLET | Freq: Two times a day (BID) | ORAL | 1 refills | 30.00000 days | Status: AC
Start: 2021-01-05 — End: 2021-01-06

## 2021-01-05 MED ORDER — ROCURONIUM 10 MG/ML INTRAVENOUS SOLUTION
10 | INTRAVENOUS | Status: DC | PRN
Start: 2021-01-05 — End: 2021-01-05
  Administered 2021-01-05: 19:00:00 10 via INTRAVENOUS
  Administered 2021-01-05 (×2): 20 via INTRAVENOUS
  Administered 2021-01-05: 15:00:00 100 via INTRAVENOUS

## 2021-01-05 MED ORDER — ENOXAPARIN 30 MG/0.3 ML SUBCUTANEOUS SYRINGE: 30 mg/0.3 mL | SUBCUTANEOUS | Status: DC

## 2021-01-05 MED ORDER — BUPIVACAINE (PF) 0.5 % (5 MG/ML) INJECTION SOLUTION
5 | INTRAMUSCULAR | Status: AC
Start: 2021-01-05 — End: ?

## 2021-01-05 MED ORDER — HYDROMORPHONE (PF) 2 MG/ML INJECTION SOLUTION
2 | INTRAMUSCULAR | Status: AC
Start: 2021-01-05 — End: ?

## 2021-01-05 MED ORDER — HYDROMORPHONE (PF) 0.5 MG/0.5 ML INJECTION SYRINGE
0.5 | INTRAMUSCULAR | Status: DC | PRN
Start: 2021-01-05 — End: 2021-01-05

## 2021-01-05 MED ORDER — OXYCODONE 5 MG TABLET
5 | Freq: Once | ORAL | Status: DC | PRN
Start: 2021-01-05 — End: 2021-01-05

## 2021-01-05 MED ORDER — DEXAMETHASONE SODIUM PHOSPHATE 4 MG/ML INJECTION SOLUTION
4 | INTRAMUSCULAR | Status: AC
Start: 2021-01-05 — End: ?

## 2021-01-05 MED ORDER — METRONIDAZOLE 500 MG/100 ML IN SODIUM CHLOR(ISO) INTRAVENOUS PIGGYBACK
500 | INTRAVENOUS | Status: AC
Start: 2021-01-05 — End: ?

## 2021-01-05 MED ORDER — ONDANSETRON HCL 4 MG TABLET: 4 mg | ORAL | Status: AC | PRN

## 2021-01-05 MED ORDER — EPHEDRINE SULFATE 50 MG/ML INTRAVENOUS SOLUTION
50 | INTRAVENOUS | Status: AC
Start: 2021-01-05 — End: ?

## 2021-01-05 MED ORDER — DEXMEDETOMIDINE 200 MCG/50 ML (4 MCG/ML) IN 0.9 % SODIUM CHLORIDE IV
200 | INTRAVENOUS | Status: DC | PRN
Start: 2021-01-05 — End: 2021-01-05
  Administered 2021-01-05: 15:00:00 0 via INTRAVENOUS

## 2021-01-05 MED ORDER — CEFAZOLIN 1 GRAM SOLUTION FOR INJECTION
1 | INTRAMUSCULAR | Status: AC
Start: 2021-01-05 — End: ?

## 2021-01-05 MED ORDER — SUGAMMADEX 100 MG/ML INTRAVENOUS SOLUTION
100 | INTRAVENOUS | Status: AC
Start: 2021-01-05 — End: ?

## 2021-01-05 MED ORDER — HYDROMORPHONE 2 MG/ML INJECTION SOLUTION
2 | INTRAMUSCULAR | Status: DC | PRN
Start: 2021-01-05 — End: 2021-01-05
  Administered 2021-01-05 (×4): 0 via INTRAVENOUS

## 2021-01-05 MED ORDER — METRONIDAZOLE 500 MG/100 ML IN SODIUM CHLOR(ISO) INTRAVENOUS PIGGYBACK
500 | INTRAVENOUS | Status: DC | PRN
Start: 2021-01-05 — End: 2021-01-05
  Administered 2021-01-05: 16:00:00 500 via INTRAVENOUS

## 2021-01-05 MED ORDER — DEXAMETHASONE SODIUM PHOSPHATE 4 MG/ML INJECTION SOLUTION
4 | INTRAMUSCULAR | Status: DC | PRN
Start: 2021-01-05 — End: 2021-01-05
  Administered 2021-01-05: 15:00:00 8 via INTRAVENOUS

## 2021-01-05 MED ORDER — GABAPENTIN 300 MG CAPSULE: 300 mg | ORAL | Status: DC

## 2021-01-05 MED ORDER — PROPOFOL 10 MG/ML INTRAVENOUS EMULSION
10 | INTRAVENOUS | Status: DC | PRN
Start: 2021-01-05 — End: 2021-01-05
  Administered 2021-01-05 (×2): 50 via INTRAVENOUS
  Administered 2021-01-05: 15:00:00 100 via INTRAVENOUS

## 2021-01-05 MED ORDER — SODIUM CHLORIDE 0.9 % (FLUSH) INJECTION SYRINGE
0.9 % | INTRAMUSCULAR | Status: DC | PRN
Start: 2021-01-05 — End: 2021-01-06

## 2021-01-05 MED ORDER — MAGNESIUM SULFATE 1 GRAM/100 ML IN DEXTROSE 5 % INTRAVENOUS PIGGYBACK
1 | INTRAVENOUS | Status: DC | PRN
Start: 2021-01-05 — End: 2021-01-05
  Administered 2021-01-05: 15:00:00 1 via INTRAVENOUS

## 2021-01-05 MED ORDER — LIDOCAINE (PF) 10 MG/ML (1 %) INJECTION SOLUTION
10 | Freq: Once | INTRAMUSCULAR | Status: DC | PRN
Start: 2021-01-05 — End: 2021-01-05

## 2021-01-05 MED ORDER — ENOXAPARIN 40 MG/0.4 ML SUBCUTANEOUS SYRINGE
400.4 | Freq: Every day | SUBCUTANEOUS | Status: DC
Start: 2021-01-05 — End: 2021-01-06
  Administered 2021-01-06: 18:00:00 via SUBCUTANEOUS

## 2021-01-05 MED ORDER — GABAPENTIN 300 MG CAPSULE
300 | ORAL | Status: AC
Start: 2021-01-05 — End: 2021-01-05
  Administered 2021-01-05: 13:00:00 via ORAL

## 2021-01-05 MED ORDER — ENOXAPARIN 40 MG/0.4 ML SUBCUTANEOUS SYRINGE
40 | Freq: Every day | SUBCUTANEOUS | 0 refills | Status: DC
Start: 2021-01-05 — End: 2021-01-06

## 2021-01-05 MED ORDER — PROCHLORPERAZINE EDISYLATE 10 MG/2 ML (5 MG/ML) INJECTION SOLUTION
10 | Freq: Four times a day (QID) | INTRAMUSCULAR | Status: DC | PRN
Start: 2021-01-05 — End: 2021-01-05

## 2021-01-05 MED ORDER — HYDROMORPHONE (PF) 0.5 MG/0.5 ML INJECTION SYRINGE: 0.5 mg/0.5 mL | INTRAMUSCULAR | Status: AC | PRN

## 2021-01-05 MED ORDER — PROPOFOL 10 MG/ML INTRAVENOUS EMULSION
10 | INTRAVENOUS | Status: DC | PRN
Start: 2021-01-05 — End: 2021-01-05
  Administered 2021-01-05: 15:00:00 via INTRAVENOUS

## 2021-01-05 MED ORDER — FENTANYL (PF) 50 MCG/ML INJECTION SOLUTION
50 | INTRAMUSCULAR | Status: DC | PRN
Start: 2021-01-05 — End: 2021-01-05

## 2021-01-05 MED ORDER — CEFAZOLIN 1 GRAM SOLUTION FOR INJECTION
1 | INTRAMUSCULAR | Status: DC | PRN
Start: 2021-01-05 — End: 2021-01-05
  Administered 2021-01-05: 16:00:00 2000 via INTRAVENOUS

## 2021-01-05 MED ORDER — PHENYLEPHRINE 10 MG/ML INJECTION SOLUTION
10 | INTRAMUSCULAR | Status: DC | PRN
Start: 2021-01-05 — End: 2021-01-05
  Administered 2021-01-05: 16:00:00 100 via INTRAVENOUS

## 2021-01-05 MED ORDER — KETOROLAC 30 MG/ML (1 ML) INJECTION SOLUTION
30 | INTRAMUSCULAR | Status: DC | PRN
Start: 2021-01-05 — End: 2021-01-05
  Administered 2021-01-05: 19:00:00 15 via INTRAVENOUS

## 2021-01-05 MED ORDER — BUPIVACAINE (PF) 0.5 % (5 MG/ML) INJECTION SOLUTION
5 | INTRAMUSCULAR | Status: DC | PRN
Start: 2021-01-05 — End: 2021-01-05
  Administered 2021-01-05: 17:00:00 14 via INTRAMUSCULAR
  Administered 2021-01-05: 19:00:00 16 via INTRAMUSCULAR

## 2021-01-05 MED ORDER — PHENYLEPHRINE 1 MG/10 ML (100 MCG/ML) IN 0.9 % SOD.CHLORIDE IV SYRINGE
1 | INTRAVENOUS | Status: AC
Start: 2021-01-05 — End: ?

## 2021-01-05 MED ORDER — OXYCODONE 5 MG TABLET
5 | ORAL_TABLET | Freq: Three times a day (TID) | ORAL | 0 refills | 7.00000 days | Status: AC | PRN
Start: 2021-01-05 — End: 2021-01-06

## 2021-01-05 MED ORDER — EPHEDRINE SULFATE 50 MG/ML INTRAVENOUS SOLUTION
50 | INTRAVENOUS | Status: DC | PRN
Start: 2021-01-05 — End: 2021-01-05
  Administered 2021-01-05: 16:00:00 5 via INTRAVENOUS
  Administered 2021-01-05 (×2): 10 via INTRAVENOUS

## 2021-01-05 MED ORDER — DEXTROSE 5 % AND LACTATED RINGERS INTRAVENOUS SOLUTION
INTRAVENOUS | Status: AC
  Administered 2021-01-05: 20:00:00 via INTRAVENOUS
  Administered 2021-01-05: 22:00:00

## 2021-01-05 MED ORDER — ACETAMINOPHEN 500 MG GELCAP
500 | ORAL | Status: AC
Start: 2021-01-05 — End: 2021-01-05
  Administered 2021-01-05: 13:00:00 via ORAL

## 2021-01-05 MED ORDER — DEXMEDETOMIDINE 100 MCG/ML INTRAVENOUS SOLUTION
100 | INTRAVENOUS | Status: DC | PRN
Start: 2021-01-05 — End: 2021-01-05
  Administered 2021-01-05: 15:00:00 10 via INTRAVENOUS
  Administered 2021-01-05 (×2): 5 via INTRAVENOUS

## 2021-01-05 MED ORDER — ACETAMINOPHEN 500 MG TABLET
500 mg | ORAL | Status: DC
  Administered 2021-01-05: 22:00:00 via ORAL

## 2021-01-05 MED ORDER — ATORVASTATIN 10 MG TABLET
10 mg | ORAL | Status: AC
  Administered 2021-01-06: 03:00:00 via ORAL

## 2021-01-05 MED ORDER — LACTATED RINGERS INTRAVENOUS SOLUTION
INTRAVENOUS | Status: DC | PRN
Start: 2021-01-05 — End: 2021-01-05
  Administered 2021-01-05: 15:00:00 via INTRAVENOUS

## 2021-01-05 MED ORDER — SODIUM CHLORIDE 0.9 % (FLUSH) INJECTION SYRINGE
0.9 % | INTRAMUSCULAR | Status: AC
  Administered 2021-01-05 – 2021-01-06 (×3): via INTRAVENOUS

## 2021-01-05 MED ORDER — LOSARTAN 50 MG TABLET
50 mg | ORAL | Status: AC
  Administered 2021-01-06: 03:00:00 via ORAL

## 2021-01-05 MED ORDER — ACETAMINOPHEN 500 MG TABLET
500 | ORAL_TABLET | Freq: Three times a day (TID) | ORAL | 0 refills | 13.00000 days | Status: DC
Start: 2021-01-05 — End: 2021-01-06

## 2021-01-05 MED ORDER — ONDANSETRON HCL (PF) 4 MG/2 ML INJECTION SOLUTION
42 | Freq: Four times a day (QID) | INTRAMUSCULAR | Status: DC | PRN
Start: 2021-01-05 — End: 2021-01-06
  Administered 2021-01-05: via INTRAVENOUS

## 2021-01-05 MED ORDER — NALOXONE 0.4 MG/ML INJECTION SOLUTION: 0.4 mg/mL | INTRAMUSCULAR | Status: AC | PRN

## 2021-01-05 MED ORDER — GABAPENTIN 300 MG CAPSULE
300 | Freq: Once | ORAL | Status: AC
Start: 2021-01-05 — End: 2021-01-05

## 2021-01-05 MED ORDER — ROCURONIUM 10 MG/ML INTRAVENOUS SOLUTION
10 | INTRAVENOUS | Status: AC
Start: 2021-01-05 — End: ?

## 2021-01-05 MED ORDER — LIDOCAINE (PF) 100 MG/5 ML (2 %) INTRAVENOUS SYRINGE
100 | INTRAVENOUS | Status: DC | PRN
Start: 2021-01-05 — End: 2021-01-05
  Administered 2021-01-05: 15:00:00 100 via INTRAVENOUS

## 2021-01-05 MED ORDER — SENNOSIDES 8.6 MG TABLET
8.6 mg | ORAL | Status: AC
  Administered 2021-01-06: 03:00:00 via ORAL

## 2021-01-05 MED ORDER — SUGAMMADEX 100 MG/ML INTRAVENOUS SOLUTION
100 | INTRAVENOUS | Status: DC | PRN
Start: 2021-01-05 — End: 2021-01-05
  Administered 2021-01-05: 19:00:00 200 via INTRAVENOUS

## 2021-01-05 MED ORDER — INDOCYANINE GREEN 25 MG SOLUTION FOR INJECTION
25 | INTRAMUSCULAR | Status: DC | PRN
Start: 2021-01-05 — End: 2021-01-05
  Administered 2021-01-05: 16:00:00 5 via INTRAMUSCULAR

## 2021-01-05 MED ORDER — MENTHOL 5.8 MG LOZENGES
5.8 | Status: DC | PRN
Start: 2021-01-05 — End: 2021-01-05

## 2021-01-05 MED ORDER — PROCHLORPERAZINE EDISYLATE 10 MG/2 ML (5 MG/ML) INJECTION SOLUTION
10 mg/2 mL (5 mg/mL) | INTRAMUSCULAR | Status: AC | PRN
  Administered 2021-01-06: 05:00:00 via INTRAVENOUS

## 2021-01-05 MED ORDER — IBUPROFEN 600 MG TABLET
600 | ORAL_TABLET | Freq: Three times a day (TID) | ORAL | 0 refills | Status: AC | PRN
Start: 2021-01-05 — End: 2021-01-15

## 2021-01-05 MED ORDER — OXYCODONE 5 MG TABLET
5 mg | ORAL | Status: AC | PRN
  Administered 2021-01-06: 03:00:00 via ORAL

## 2021-01-05 MED ORDER — POLYETHYLENE GLYCOL 3350 17 GRAM ORAL POWDER PACKET: 17 gram | ORAL | Status: DC | PRN

## 2021-01-05 MED ORDER — LACTATED RINGERS INTRAVENOUS SOLUTION
INTRAVENOUS | Status: DC
Start: 2021-01-05 — End: 2021-01-05
  Administered 2021-01-05: 14:00:00 via INTRAVENOUS

## 2021-01-05 MED ORDER — DEXMEDETOMIDINE 100 MCG/ML INTRAVENOUS SOLUTION
100 | INTRAVENOUS | Status: AC
Start: 2021-01-05 — End: ?

## 2021-01-05 MED ORDER — PROPOFOL 10 MG/ML INTRAVENOUS EMULSION
10 | INTRAVENOUS | Status: AC
Start: 2021-01-05 — End: ?

## 2021-01-05 MED ORDER — ACETAMINOPHEN 500 MG GELCAP
500 | Freq: Once | ORAL | Status: AC
Start: 2021-01-05 — End: 2021-01-05

## 2021-01-05 MED ORDER — HEPARIN, PORCINE (PF) 5,000 UNIT/0.5 ML INJECTION SOLUTION
5000 | INTRAMUSCULAR | Status: DC | PRN
Start: 2021-01-05 — End: 2021-01-05
  Administered 2021-01-05: 16:00:00 5000 via SUBCUTANEOUS

## 2021-01-05 MED ORDER — ONDANSETRON HCL (PF) 4 MG/2 ML INJECTION SOLUTION
4 | INTRAMUSCULAR | Status: DC | PRN
Start: 2021-01-05 — End: 2021-01-05
  Administered 2021-01-05: 19:00:00 4 via INTRAVENOUS

## 2021-01-05 MED ORDER — SODIUM CHLORIDE 0.9 % INTRAVENOUS SOLUTION
0.9 | INTRAVENOUS | Status: DC | PRN
Start: 2021-01-05 — End: 2021-01-05
  Administered 2021-01-05: 15:00:00 via INTRAVENOUS

## 2021-01-05 MED ORDER — ACETAMINOPHEN 1,000 MG/100 ML (10 MG/ML) INTRAVENOUS SOLUTION (ONCE)
1000 | Freq: Once | INTRAVENOUS | Status: DC
Start: 2021-01-05 — End: 2021-01-05

## 2021-01-05 MED ORDER — MAGNESIUM SULFATE 4 GRAM/50 ML (8 %) IN WATER INTRAVENOUS PIGGYBACK
4 | INTRAVENOUS | Status: AC
Start: 2021-01-05 — End: ?

## 2021-01-05 MED ORDER — LIDOCAINE (PF) 20 MG/ML (2 %) INJECTION SOLUTION
20 | INTRAMUSCULAR | Status: AC
Start: 2021-01-05 — End: ?

## 2021-01-05 MED FILL — ROCURONIUM 10 MG/ML INTRAVENOUS SOLUTION: 10 mg/mL | INTRAVENOUS | Qty: 10

## 2021-01-05 MED FILL — PHENYLEPHRINE 1 MG/10 ML (100 MCG/ML) IN 0.9 % SOD.CHLORIDE IV SYRINGE: 1 mg/0 mL (00 mcg/mL) | INTRAVENOUS | Qty: 10

## 2021-01-05 MED FILL — XYLOCAINE-MPF 20 MG/ML (2 %) INJECTION SOLUTION: 20 mg/mL (2 %) | INTRAMUSCULAR | Qty: 5

## 2021-01-05 MED FILL — ACETAMINOPHEN 500 MG TABLET: 500 mg | ORAL | Qty: 2

## 2021-01-05 MED FILL — HYDROMORPHONE (PF) 2 MG/ML INJECTION SOLUTION: 2 mg/mL | INTRAMUSCULAR | Qty: 1

## 2021-01-05 MED FILL — METRONIDAZOLE 500 MG/100 ML IN SODIUM CHLOR(ISO) INTRAVENOUS PIGGYBACK: 500 mg/100 mL | INTRAVENOUS | Qty: 100

## 2021-01-05 MED FILL — BUPIVACAINE (PF) 0.5 % (5 MG/ML) INJECTION SOLUTION: 5 mg/mL (0. %) | INTRAMUSCULAR | Qty: 30

## 2021-01-05 MED FILL — MAGNESIUM SULFATE 4 GRAM/50 ML (8 %) IN WATER INTRAVENOUS PIGGYBACK: 4 g/50 mL | INTRAVENOUS | Qty: 50

## 2021-01-05 MED FILL — DEXMEDETOMIDINE 100 MCG/ML INTRAVENOUS SOLUTION: 100 mcg/mL | INTRAVENOUS | Qty: 2

## 2021-01-05 MED FILL — GABAPENTIN 300 MG CAPSULE: 300 mg | ORAL | Qty: 2

## 2021-01-05 MED FILL — DIPRIVAN 10 MG/ML INTRAVENOUS EMULSION: 10 mg/mL | INTRAVENOUS | Qty: 50

## 2021-01-05 MED FILL — ROCURONIUM 10 MG/ML INTRAVENOUS SOLUTION: 10 mg/mL | INTRAVENOUS | Qty: 5

## 2021-01-05 MED FILL — CEFAZOLIN 1 GRAM SOLUTION FOR INJECTION: 1 gram | INTRAMUSCULAR | Qty: 2000

## 2021-01-05 MED FILL — BRIDION 100 MG/ML INTRAVENOUS SOLUTION: 100 mg/mL | INTRAVENOUS | Qty: 2

## 2021-01-05 MED FILL — ONDANSETRON HCL (PF) 4 MG/2 ML INJECTION SOLUTION: 4 mg/2 mL | INTRAMUSCULAR | Qty: 2

## 2021-01-05 MED FILL — AKOVAZ 50 MG/ML INTRAVENOUS SOLUTION: 50 mg/mL | INTRAVENOUS | Qty: 1

## 2021-01-05 MED FILL — DEXAMETHASONE SODIUM PHOSPHATE 4 MG/ML INJECTION SOLUTION: 4 mg/mL | INTRAMUSCULAR | Qty: 5

## 2021-01-05 NOTE — Interval H&P Note (Signed)
Procedure(s) (LRB):  XI ROBOTIC ASSISTED TOTAL HYSTERECTOMY;BILATERAL SALPINGO-OOPHORECTOMY WITH LYMPH NODE DISSECTION (N/A)      ATTENDING SURGEON PREOPERATIVE NOTE  Jacqueline Mclaughlin is a 65 y.o. 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  I have performed an appropriate, condition-specific physical examination today, and the indication still exists for surgery.     There are no changes in the patients health history, review of systems, or physical findings since the previously recorded history and physical.    Physical exam done today:   Vital Sign Min/Max (last 24 hours)       Value Min Max    Temp 36.2 C (97.2 F) 36.2 C (97.2 F)    Pulse 73 73    *Resp 17 17    BP: Systolic 779 390    BP: Diastolic 69 69    SpO2 97 % 97 %          Gen: NAD  GU: Defer to OR    Key elements of latest CBC values... Please see Chart Review for additional result details.  Lab Results   Component Value Date    WBC Count 6.4 11/22/2020    Hemoglobin 13.0 11/22/2020    Hematocrit 40.3 11/22/2020    MCV 85 11/22/2020    Platelet Count 241 11/22/2020         DOCUMENTATION OF INFORMED CONSENT  I have discussed the risks, benefits, and alternatives of the procedure with the patient and/or the patient's medical decision-maker.  This discussion included, but was not limited to, the risk of bleeding, infection, damage to anatomical structures, need for reoperation, or even death.  The patient and/or the patient's medical decision maker understands, has had all of his/her questions answered, and desires to proceed.  Informed consent obtained.    MEDICAL  PROXY  She identifies Tramaine Snell [phone]) asher medical proxy.    Juliane Poot MD, PGY-3  Department of [place] [facility] [place], [place]

## 2021-01-05 NOTE — Anesthesia Post-Procedure Evaluation (Signed)
Anesthesia Post-op Evaluation    Scheduled date of Operation: 01/05/2021    Scheduled Surgeon(s):Edwin Nell Range, MDMary Elisabeth Cara, MDAlison Fontaine No, MD  Scheduled Procedure(s):XI ROBOTIC ASSISTED TOTAL HYSTERECTOMY;BILATERAL SALPINGO-OOPHORECTOMY WITH LYMPH NODE DISSECTION    Final Anesthesia Type: general    Assessment  Respiratory Function:      Airway Patency: Excellent      Respiratory Rate: See vitals below      SpO2: See vitals below      Overall Respiratory Assessment: Stable  Cardiovascular Function:      Pulse Rate: See Vitals Below      Blood Pressure: See Vitals Below      Cardiac status: Stable  Mental Status:      RASS Score: 0 Alert or calm  Temperature: Normothermic  Pain Control: Adequate  Nausea and Vomiting: Absent  Fluids/Hydration Status: Euvolemic    Complications (anesthesia/case associated complications, possible complications, and/or significant issues; as of time of note completion: No apparent complications      Plan  Follow-up care: As per primary team    Post-op Note Status: Complete, patient participated in evaluation, which occurred after recovery from anesthesia but prior to 48 hours from end of case                 Recent Pre-op and Post-op Vital Signs  Vitals:    01/05/21 1300 01/05/21 1315 01/05/21 1345   BP: 112/57 109/55 117/58   Pulse: 101 92 88   Resp: 17 13 17    Temp:      TempSrc:      SpO2: 96% 94% 95%     Last Vital Signs Out of Room to Anesthesia Stop  Vitals Value Taken Time   Pulse 93 01/05/21 1237   Resp 13 01/05/21 1237   SpO2 98 % 01/05/21 1237   BP 120/54 01/05/21 1237   Arterial Line BP (mmHg)      Arterial Line MAP (mmHg)     Arterial Line 2 BP (mmHg)     Arterial Line 2 MAP (mmHg)     Temp 36.4 C (97.5 F) 01/05/21 1235   Temp src Temporal 01/05/21 1235   Vitals shown include unvalidated device data.    Case Tracking Events:  Event Time In   In [place]   In [place]   In [place]   Push Notification to  Surgeon Duanne Guess) [phone]   Anesthesia Start 0746   Anesthesia Ready 0804   In [place]   Procedure Start 0843   PACU/Unit Notified 6283   Procedure Finish 6629   Extubation 4765   YYT of Room [place] 0354   In PACU 1239   Ready for Discharge 1321

## 2021-01-05 NOTE — Plan of Care (Signed)
Problem: Activity Intolerance - Abdominal / GI / GU Surgery Patient - Adult  Goal: Improved activity tolerance ( return to admit level or baseline )  Outcome: Progress within 12 hours  Goal: Able to perform physical activity as ordered  Outcome: Progress within 12 hours     Problem: Airway, Ineffective - Abdominal / GI / GU Surgery Patient - Adult  Goal: Patent airway / effective airway clearance  Outcome: Progress within 12 hours     Problem: Anxiety, Patient / Family / Caregiver - Abdominal / GI / GU Surgery Patient - Adult  Goal: Alleviation of anxiety (Patient / Family / Caregiver)  Outcome: Progress within 12 hours  Goal: Expression of calm and reduction in anxiety symptoms  Outcome: Progress within 12 hours     Problem: Bleeding, at Risk or Actual - Abdominal / GI / GU Surgery Patient - Adult  Goal: Absence of active bleeding  Outcome: Progress within 12 hours     Problem: Deep Venous Thrombosis, Risk of - Abdominal / GI / GU Surgery Patient - Adult  Goal: Absence of deep venous thrombosis  Outcome: Progress within 12 hours     Problem: Fluid Volume, Imbalanced - Abdominal / GI / GU Surgery Patient - Adult  Goal: Absence of fluid imbalance signs and symptoms  Outcome: Progress within 12 hours     Problem: Gas Exchange, Impaired - Abdominal / GI / GU Surgery Patient - Adult  Goal: Adequate oxygenation (absence of signs and symptoms of hypoxemia)  Outcome: Progress within 12 hours  Goal: Adequate ventilation (absence of signs and symptoms of hypercarbia)  Outcome: Progress within 12 hours     Problem: GI Elimination Impaired - Abdominal / GI / GU Surgery Patient - Adult  Goal: Passage of soft, formed stool  Outcome: Progress within 12 hours  Goal: Stool elimination per clinical condition ( e.g. GI ostomies )  Outcome: Progress within 12 hours     Problem: Glycemic Control, Abnormal - Abdominal / GI / GU Surgery Patient - Adult  Goal: Maintain glucose levels within specified parameters  Outcome: Progress within  12 hours     Problem: GU Elimination Impaired - Abdominal / GI / GU Surgery Patient - Adult  Goal: Able to void spontaneously  Outcome: Progress within 12 hours  Goal: Able to perform urinary self-catheterization  Outcome: Progress within 12 hours  Goal: Able to perform urinary catheter / ostomy care  Outcome: Progress within 12 hours  Goal: Adequate urine output and no signs and symptoms of obstruction  Outcome: Progress within 12 hours     Problem: Infection, at Risk and Actual - Abdominal / GI / GU Surgery Patient - Adult  Goal: Prevention of infection  Outcome: Progress within 12 hours  Goal: Resolution of Infection  Outcome: Progress within 12 hours     Problem: Mobility, Impaired - Abdominal / GI / GU Surgery Patient - Adult  Goal: Able to perform exercises as instructed  Outcome: Progress within 12 hours  Goal: Able to participate in ordered Rehab therapy (Patient / Family / Caregiver)  Outcome: Progress within 12 hours  Goal: Able to participate in repositioning and/or transferring (Patient / Family / Caregiver)  Outcome: Progress within 12 hours  Goal: Demonstrates safety with mobility / transfers  Outcome: Progress within 12 hours  Goal: Able to use ambulatory assistive device appropriately  Outcome: Progress within 12 hours  Goal: Able to propel wheelchair with assistance as needed  Outcome: Progress within 12 hours  Problem: Nausea/Vomiting - Abdominal / GI / GU Surgery Patient - Adult  Goal: Absence of nausea and vomiting  Outcome: Progress within 12 hours     Problem: Nutrition, Alteration in - Abdominal / GI / GU Surgery Patient - Adult  Goal: Adequate nutritional intake  Outcome: Progress within 12 hours  Goal: Maximize nutritional intake per patient condition  Outcome: Progress within 12 hours     Problem: Pain,  Acute / Chronic - Abdominal / GI / GU Surgery Patient - Adult  Goal: Control of chronic pain  Outcome: Progress within 12 hours  Goal: Control of acute pain  Outcome: Progress within 12  hours  Goal: Involvement in pain management plan  Outcome: Progress within 12 hours     Problem: Sexual Dysfunction - Abdominal / GI / GU Surgery Patient - Adult  Goal: Knowledge of anatomical and sexual changes  Outcome: Progress within 12 hours     Problem: Skin Integrity, Impaired - Abdominal / GI / GU Surgery Patient - Adult  Goal: Skin / incision / wound healing  Outcome: Progress within 12 hours     Problem: Sleep Disturbance, at Risk or Actual - Abdominal / GI / GU Surgery Patient - Adult  Goal: Optimal balance of rest and activity demonstrated or reported  Outcome: Progress within 12 hours     Problem: Thrombosis, Risk of Central / Mid Line Related - Abdominal / GI / GU Surgery Patient - Adult  Goal: Absence of thrombosis  Outcome: Progress within 12 hours

## 2021-01-05 NOTE — Progress Notes (Addendum)
[facility]  Progress Note - Gynecologic Oncology    ID:  65 y.o. 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 Pelvic washings. Uncomplicated. EBL 89ml. POD#0     Events  - POD#0      Subjective  Had some nausea and emesis while eating. Got zofran feels better. Ate more pineapple without issues. Has minimal abdominal pain. Not passing gas yet. Drinking water.     Continuous Meds   dextrose 5 % in lactated ringers 125 mL/hr (01/05/21 1302)       Scheduled Meds   0.9% sodium chloride flush  3 mL Intravenous Q8H [place]    acetaminophen  1,000 mg Oral Q8H [place]    atorvastatin  10 mg Oral Q PM [place]    [START ON 01/06/2021] enoxaparin  40 mg Subcutaneous Daily [place]    losartan  50 mg Oral Daily At Bedtime [facility]    senna  17.2 mg Oral Daily At Bedtime [facility]       PRN Meds   0.9% sodium chloride flush  3 mL Intravenous PRN    HYDROmorphone  0.2-0.8 mg Intravenous Q2H PRN    naloxone  0.1 mg Intravenous Q1 Min PRN    ondansetron  4 mg Oral Q6H PRN    Or    ondansetron  4 mg Intravenous Q6H PRN    oxyCODONE  5-10 mg Oral Q4H PRN    polyethylene glycol  17 g Oral Daily PRN       Ins/out    Intake/Output Summary (Last 24 hours) at 01/05/2021 1758  Last data filed at 01/05/2021 1705  Gross per 24 hour   Intake 2400.33 ml   Output 1350 ml   Net 1050.33 ml       I/O  Timeline      10/10 0701  10/11 0700 10/11 0701  10/12 0700 10/12 0701  10/13 0700 10/13 0701  10/14 0700 10/14 0701  10/15 0700    P.O.     400    I.V. (mL/kg)     2000.33 (26.74)    Total Intake(mL/kg)     2400.33 (32.09)    Urine (mL/kg/hr)     1325 (1.62)    Emesis     0    Stool     0    Blood     25    Total Output(mL/kg)     1350 (18.05)    Net     +1050.33             Unmeasured Stool Occurrence     0 x    Unmeasured Emesis Occurrence     1 x          PO: 400   UOP: 112 cc/hr last 12 hours     Vitals  Vitals    01/05/21 1345 01/05/21 1425 01/05/21 1529 01/05/21 1533   BP: 117/58 116/40 126/59 102/44   BP Location:  Right  upper arm Right upper arm Right upper arm   Patient Position: Lying Lying Lying Sitting   Pulse: 88 96 91 95   Resp: 17 16 16 16    Temp:  36.6 C (97.9 F)  36.6 C (97.9 F)   TempSrc:  Oral  Oral   SpO2: 95% 95%  96%   Weight:  74.8 kg (165 lb)     Height:              General: Well appearing. No acute distress.  CV: Regular rate. Normal peripheral perfusion.  Respiratory: Normal work of breathing  Abdomen: Soft. Mild tenderness to palpation.  Skin: Port site incisions c/d/i with dressing in place.  GU:  No vaginal bleeding.   Extremities: No peripheral edema, SCD in place  Neuro: Alert, oriented to person, place and time      Summary of Labs/Studies  Recent Labs     01/05/21  0616   K 3.3*        Imaging:  Radiology Results (last week)     ** No results found for the last 168 hours. **           DVT prophylaxis?  - Lovenox for 14 days starting 10/15 @ 0600     Assessment & Plan  65 y.o. 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 Pelvic washings. Uncomplicated. EBL 61ml. POD#0     Onc:  Abd pain minimal at this time.    - Pain: Well controlled on tylenol and ibuprofen.   - Intake:  Tolerating regular diet   - Output:    - Mobility: Ambulate/OOB TID as tolerated  - DVT PPx:  Lovenox 40mg  every day    - Path: Will f/u final path with Dr. [name] on 11/12      Heme/CV:  Stable. No signs/symptoms of active bleeding.  Pre-op Hgb 13.4 --> EBL 25cc --> POD1 Hgb []    - HTN: Hold PTA Losartan 50mg  at bedtime if BP <110, and Chlorthalidone 25mg  at bedtime   - HLD: continue PTA Atorvastatin 10mg  every day  []  AM CBC  []  AM BMP     GU:  Foley out  []  needs to void      GI:  Denies nausea.   - Zofran ordered prn  - Regular diet   - F/u lytes now and replete prn      Respiratory:  No active issues   - Encourage ICS     ID:  No active issues.     Dispo: Home tomorrow     Inpatient Checklist   Code status: Full   Isolation precautions: Not  indicated   Monitoring: None indicated   Diet: Regular    DVT prophylaxis: Chemical and SCDs   Bowel regimen: Standing   Delirium bundle: Not indicated   Disposition: Home when meeting all postoperative goals       Patient is currently being treated for the following conditions:  - Post op     Code Status: Full     Melba Coon, MD PGY-1  Obstetrics, Gynecology & [place] [facility] [place]  01/05/21

## 2021-01-05 NOTE — Plan of Care (Signed)
Problem: Anxiety - Perianesthesia Care  Goal: Minimize anxiety  Outcome: Adequate for Discharge     Problem: Pain Acute / Chronic - Perianesthesia Care  Goal: Control of acute pain  Outcome: Adequate for Discharge

## 2021-01-05 NOTE — Brief Op Note (Signed)
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]    Procedure(s) and Anesthesia Type:     * XI ROBOTIC ASSISTED TOTAL HYSTERECTOMY;BILATERAL SALPINGO-OOPHORECTOMY  - Anes-General    Implants: * No implants in log *     ID Type Source Tests Collected by Time Destination   1 : pelvic washings Other Other(specify) (Pathology) CYTOLOGY, NON-GYNECOLOGIC (DELIVER SPECIMEN TO PATHOLOGY) Kerin Perna, RN 14-Jan-2021 [phone]    2 : uterus, cervix, bilateral fallopian tubes and ovaries(frozen) Other Other(specify) (Pathology) [place], RN January 14, 2021 1109           Findings: globular uterus with leiomyoma, dense adhesions from omentum to anterior abdominal wall and from uterus to bladder and anterior abdominal wall. Normal appearing ovaries, appendix, liver surface.     Drains: urine foley to gravity    Complications: none    Disposition and Plan: extubated and transferred to PACU    Estimated Blood Loss: 25 mL      Was VTE prophylaxis ordered? Yes     Medication ordered: Heparin    Were NSAIDS ordered? Yes    Surgeon Attendance: I was scrubbed, present and performed the surgery as described.     Esbeydi Manago A. [name], MD  Provider number [phone]  Professor, [place] [facility] [place], [place]

## 2021-01-05 NOTE — Op Note (Signed)
OPERATIVE REPORT    DATE OF OPERATION:  01/05/2021    PREOPERATIVE DIAGNOSIS:  Endometrial adenocarcinoma.    POSTOPERATIVE DIAGNOSIS:  Endometrial adenocarcinoma.    OPERATION:  Xi robotic-assisted total laparoscopic hysterectomy,   bilateral salpingo-oophorectomy.    ANESTHESIA:  General endotracheal.    CLINICAL INDICATIONS:    FINDINGS:  Globular uterus with leiomyoma.  Dense adhesions from the   omentum to anterior abdominal wall and from the uterus to bladder and   from the uterus to the anterior abdominal wall.  Dissection took   greater than an hour, adding difficulty to the case.    Normal-appearing ovaries, appendix, and liver surface.  Pathology, on   gross inspection, there was a very small, less than 0.5 cm tumor with   minimal invasion.    PROCEDURE:  The patient was taken to the operating room and placed in   the dorsal supine position.  She was then placed in the low lithotomy   in the [place] after general anesthesia was administered.  A   Foley catheter was placed under sterile conditions.  ICG was injected   into the cervix in the usual fashion at 3 and 9, a total of 4 mL was   used, 25 mg in 20 mL of water solution.  The uterine manipulator was   placed in the cervix and sutured into place.  We made a left upper   quadrant incision.  A Veress needle was inserted into the abdomen and   shown to be intraperitoneal in the usual fashion.  We placed a   Visiport directly into that as a robotic accessory Visiport directly   into the left upper quadrant with no internal abdominal injuries   noted.  We then placed a midline 8 mm robotic accessory port and 2   right upper quadrant 8 mm accessory ports, and then a left upper   quadrant 12 mm assistant port. These were placed under direct   visualization and all the incisions were infiltrated with 0.25%   Marcaine before incision.  We put the patient in Trendelenburg. We   took down the adhesions from the omentum to anterior abdominal wall   using  monopolar cautery.  We manipulated the bowel out of the pelvis   and then we obtained our washings.  We removed the laparoscopic   instruments and docked the robot.  We opened up our retroperitoneal   spaces on both sides of the pelvis.  We identified the ureters   bilaterally. We coapted and transected the round ligaments.  We, at   this point, searched for her sentinel lymph nodes; however, there was   no ICG in the pelvic sidewall or the presacral spaces or at the end   of the case in the periaortic spaces.  It was a failed sentinel lymph   node mapping.  We coapted and transected the infundibulopelvic   ligaments.  We skeletonized the uterine arteries.  We took down the   bladder off of the uterus and cervix, and the adhesions from the   anterior abdominal wall to the uterus.  These adhesions were dense.    We had to undermine the scar with a lateral approach or taking the   bladder off the uterus and cervix.  At this point we replaced the   manipulator and at this point we had an iatrogenic perforation of the   uterus.  This was done under direct visualization.  We took down the  uterine arteries with the vessel sealer.  These were coapted and   transected as were the cardinal ligaments.  We then were able to make   our circumferential colpotomy on the cervical cuff of the manipulator   and we were able to deliver the uterus, cervix, ovaries, and tubes   through the vagina.  We closed the vagina using running 0 Vicryl   suture with excellent hemostasis.  We irrigated the pelvis.  At this   point the pathology results returned, so we closed the left upper   quadrant 12 mm assistant port using Carter-Thomason device.  We   removed the robotic instruments, reduced pneumoperitoneum, and closed   all the suture sites with 4-0 Monocryl at the skin.  Sponge, lap, and   needle counts were correct x2.  The patient was extubated and taken   to postoperative anesthesia care unit in stable condition.  Of note,   the  patient received Ancef and Flagyl for surgical site infection   prophylaxis and heparin for DVT prophylaxis.    IMPLANTS:  None.    SPECIMENS REMOVED:  Pelvic washings, uterus, cervix, ovaries, and   tubes for frozen section and gross analysis by Pathology.    DRAINS:  Urine Foley to gravity drainage.    COMPLICATIONS:  None.    DISPOSITION AND PLAN:  Extubated and transferred to the postoperative   anesthesia care unit in stable condition.    ESTIMATED BLOOD LOSS:  25 mL.    ATTENDING SURGEON:  Ovidio Hanger, MD    SURGICAL ATTENDANCE:  I, Ovidio Hanger, MD, was scrubbed, present,   and performed the surgery, as described.     ASSISTANT SURGEONS:  Carvel Getting, MD  Juliane Poot, MD      DICTATED BY: Wilma Flavin, MD   RESPONSIBLE SURGEON: Wilma Flavin, MD        D: 01/05/2021  12:19 P  T: 01/05/2021  08:31 P ACU  Job #: [id number]

## 2021-01-05 NOTE — Anesthesia Procedure Notes (Signed)
Airway  Date/Time: 01/05/2021 8:03 AM  Urgency: elective    Difficult airway: no    Final Airway Details  Final airway type: endotracheal airway      Successful airway: ETT - single  Cuffed: yes   Successful intubation technique: direct laryngoscopy  Endotracheal tube insertion site: oral  Blade: Macintosh  Blade size: #3  ETT size (mm): 6.5  Cormack-Lehane Classification (DL): grade I - full view of glottis  Placement verified by: capnometry and chest rise   Inital cuff pressure (cm H2O): 30  Measured from: lips  ETT Depth (cm): 21  Number of attempts at approach: 1    General Information and Staff  Patient location during procedure: OR  Performed: anesthesia attending   Anesthesiologist: Catalina Gravel, MD    Indications and Patient Condition  Indications for airway management: anesthesia  Sedation level: anesthesia  Preoxygenated: yes  Patient position: sniffing  Mask difficulty assessment: 1 - vent by mask      For full procedure information, see associated anesthesia event/encounter.

## 2021-01-05 NOTE — Discharge Instructions (Addendum)
Discharge instructions following a laparoscopic surgery     How to Care for Yourself at [facility] may experience:  Pain in your upper back or shoulders from the gas used to enlarge your abdomen. The pain usually goes away in 5-7 days.   Light bleeding from the vagina.  Soreness, bruising, and mild swelling near the incisions.  Constipation  Feeling tired, especially for the first 24 hours you are home.    Activity  Take it easy for the rest of the day after you are discharged.   The first day after surgery, you can be active. Walking is a good choice. Each day, do a little more as you feel able.   Rest when you feel tired.  Do not drive while taking narcotics (such as Oxycodone).  To prevent complications such as hernia, avoid lifting anything greater than 20 lbs or anything that would cause strain on the muscles in your abdomen.   Do not have vaginal intercourse or place anything in the vagina for six weeks after surgery or until your doctor tells you it is safe to do so. This includes tampons, a contraceptive ring, vaginal suppositories and creams.     Diet  You can resume your normal diet. If your stomach is upset, try bland, low-fat foods like plain rice, toast, and yogurt. Increasing fiber can help avoid constipation.  It is common to experience constipation right after surgery. The first bowel movement after surgery may be uncomfortable; try to avoid straining. Drink plenty of water, warm teas, and/or walk to help decrease the discomfort.   It is important that you are passing gas daily. Continue to take your stool softeners/laxatives until your bowels return to normal. If you do not have a bowel movement in 2-3 days, add another laxative, such as Miralax which you can buy over-the-counter. If you are having loose stool, please stop all laxatives until loose stools resolve. Call the clinic at [phone] if you do not have a bowel movement within 3 days of surgery or have more than 3 loose stools in 24  hours.  Avoid alcohol for at least 24 hours after surgery and while on pain medications, including Tylenol (acetaminophen).    Medicines  Your health care provider will tell you if and when you can restart your home medicines. Read and follow all instructions on the label.  If your health care provider gave you a prescription medicine for pain, take it as prescribed.  Please call the help line [phone]) right away for increased abdominal pain or pain not controlled by medication.    Tylenol (acetaminophen) 500 mg tablets: Take 2 tablets (1,000 mg total) by mouth 3 times per day.  We recommend you use Tylenol around the clock for at least 3 days following surgery and then use thereafter as needed for pain.  Motrin (ibuprofen) 600 mg tablets: Take one tablet 3 times per day. We recommend you use Motrin around the clock for at least 3 days following surgery and then use thereafter as needed for pain. Swallow ibuprofen tablets or capsules whole with a glass of water or juice. You should take ibuprofen tablets and capsules after a meal or snack or with a drink of milk. It will be less likely to upset your stomach. Do not chew, break, crush or suck them as this could irritate your mouth or throat. Risk of GI upset and/or GI bleed with ibuprofen. If you develop coffee ground emesis, blood in vomit or stool,  black tarry stools, abdominal cramping or bloating, this could all be potential signs of GI bleeding. Please stop ibuprofen if you develop these symptoms and call our team.  Oxycodone 5 mg tablets: Take 1-2 tablets (5-10 mg total) by mouth every 6-8 hours for severe pain only. We don't anticipate you will need this. Please see below on how to get rid of opioids.   Senna 8.6 mg tablets: Take 2 tablets (17.2 mg total) by mouth nightly at bedtime. Do not take any medication for diarrhea.  Lovenox (enoxaparin) 40mg  syringe: Inject under the skin once daily for 13 days after surgery to prevent blood clots. * If this was  prescribed, you or a family member will be taught how to give these injections before you go home. It is okay to continue Ibuprofen or Naprosyn (NSAIDs) while administering Lovenox. Please continue Lovenox for the full four weeks unless told to stop sooner by your surgical team.    HOW TO SAFELY DISPOSE OF UNUSED PRESCRIBED MEDICATIONS:    Option 1: Bring your pills to a drop-off kiosk. Go to this website for the nearest location ([email or web address])    Option 2: Request a Mail-Back Envelope - Residents unable to travel to a drop-off or mail-back envelope distribution location may request a free, prepaid envelope be sent to their home by calling [phone].    DO NOT flush medicine down the toilet or sink. DO NOT put medicine in the trash. DO NOT keep unused medicines lying around.    Incision Care  Please remove the bandages covering your incisions 24 hours after surgery. They do not need to be replaced. Under these bandages you will have stitches, Steri-strip (paper tape closures), or skin glue. The Steri-strip closures will fall off by themselves in seven to ten days. The skin glue will also fall off in seven to ten days. Please do not pull the Steri-strips or skin glue off, allow them to come off themselves  Please shower 24 to 48 hours after surgery. Stand with your back to the shower head; wash with gentle soap and water. Avoid scrubbing. Pat the incision dry.   Avoid tub baths, hot tubs, and/or swimming until after your postoperative visit with your surgeon.  Wash the area daily with warm, soapy water, and pat it dry. Don't use hydrogen peroxide, ointments, creams, powders or alcohol as they can slow the healing process.  Leaving the incisions exposed to the air can promote healing.   You may cover the area with a gauze bandage if it oozes fluid or rubs against clothing. Change the bandage every day.    Other instructions  To reduce pain and keep swelling down, you may use an ice pack  on your incision.  Avoid direct contact with your skin and limit use to 20 minutes at a time. Please do not use heat unless discussing with the doctors or nurses.  If you have any fevers, pain or nausea not controlled with oral medications, or any other symptoms concerning to you, please call [phone].   Follow-up care is a key part of your recovery. Please plan to attend your postoperative appointment 2 to 4 weeks after surgery. Please note that pathology results and treatment planning will be discussed at this visit. If you do not have a post-operative appointment, please call our office to schedule one.      When should you call for help?    Call 911 anytime you think you may need emergency care. For example:  If you experience a temporary loss of consciousness (faint or pass out).  You have sudden chest pain and shortness of breath, or you cough up blood.    Call your doctor now or seek immediate medical care if:  You are having vaginal bleeding that soaks through a maxi pad every hour for 2 or more hours.  You have sudden, severe pain in your belly or pelvis.  You have loose stitches, or your incision comes open.  Red streaks leading from the incision.  Increased pain, swelling, warmth, or redness at the incision.  Pus draining from the incision.  A fever above 100.5 Fahrenheit or shaking chills.  Nausea or vomiting.  More than 3 episodes of diarrhea in a 24 hour period.

## 2021-01-06 LAB — BASIC METABOLIC PANEL (NA, K, CL, CO2, BUN, CR, GLU, CA)
Anion Gap: 9 (ref 4–14)
Calcium, total, Serum / Plasma: 8.3 mg/dL — ABNORMAL LOW (ref 8.4–10.5)
Carbon Dioxide, Total: 25 mmol/L (ref 22–29)
Chloride, Serum / Plasma: 105 mmol/L (ref 101–110)
Creatinine: 0.6 mg/dL (ref 0.55–1.02)
Glucose, non-fasting: 114 mg/dL (ref 70–199)
Potassium, Serum / Plasma: 3.2 mmol/L — ABNORMAL LOW (ref 3.5–5.0)
Sodium, Serum / Plasma: 139 mmol/L (ref 135–145)
Urea Nitrogen, serum/plasma: 12 mg/dL (ref 7–25)
eGFRcr: 100 mL/min/{1.73_m2} (ref >59)

## 2021-01-06 LAB — PHOSPHORUS, SERUM / PLASMA: Phosphorus, Serum / Plasma: 3.4 mg/dL (ref 2.3–4.7)

## 2021-01-06 LAB — MAGNESIUM, SERUM / PLASMA: Magnesium, Serum / Plasma: 2.3 mg/dL (ref 1.6–2.6)

## 2021-01-06 LAB — COMPLETE BLOOD COUNT
Hematocrit: 36.7 % (ref 36.0–46.0)
Hemoglobin: 11.7 g/dL — ABNORMAL LOW (ref 12.0–15.5)
MCH: 26.9 pg (ref 26.0–34.0)
MCHC: 31.9 g/dL (ref 31.0–36.0)
MCV: 84 fL (ref 80–100)
Platelet Count: 228 10*9/L (ref 140–450)
RBC Count: 4.35 10*12/L (ref 4.00–5.20)
WBC Count: 10.5 10*9/L — ABNORMAL HIGH (ref 3.4–10.0)

## 2021-01-06 LAB — COVID-19 RNA, RT-PCR/NUCLEIC ACID AMPLIFICATION: COVID-19 RNA, RT-PCR/Nucleic Acid Amplification: NOT DETECTED

## 2021-01-06 MED ORDER — OXYCODONE 5 MG TABLET
5 | ORAL_TABLET | ORAL | 0 refills | 7.00000 days | Status: AC | PRN
Start: 2021-01-06 — End: 2022-04-10

## 2021-01-06 MED ORDER — ENOXAPARIN 40 MG/0.4 ML SUBCUTANEOUS SYRINGE: 40 mg/0.4 mL | SUBCUTANEOUS | 0 refills | Status: AC

## 2021-01-06 MED ORDER — NALOXONE 4 MG/ACTUATION NASAL SPRAY
4 | Freq: Once | NASAL | 0 refills | Status: AC | PRN
Start: 2021-01-06 — End: ?

## 2021-01-06 MED ORDER — SENNOSIDES 8.6 MG TABLET: 8.6 mg | ORAL | 1 refills | Status: AC

## 2021-01-06 MED ORDER — ACETAMINOPHEN 500 MG TABLET: 500 mg | ORAL | 0 refills | Status: AC

## 2021-01-06 MED FILL — ENOXAPARIN 40 MG/0.4 ML SUBCUTANEOUS SYRINGE: 40 mg/0.4 mL | SUBCUTANEOUS | Qty: 0.4

## 2021-01-06 MED FILL — LOSARTAN 50 MG TABLET: 50 mg | ORAL | Qty: 1

## 2021-01-06 MED FILL — ATORVASTATIN 10 MG TABLET: 10 mg | ORAL | Qty: 1

## 2021-01-06 MED FILL — PROCHLORPERAZINE EDISYLATE 10 MG/2 ML (5 MG/ML) INJECTION SOLUTION: 10 mg/2 mL (5 mg/mL) | INTRAMUSCULAR | Qty: 2

## 2021-01-06 MED FILL — OXYCODONE 5 MG TABLET: 5 mg | ORAL | Qty: 1

## 2021-01-06 MED FILL — GERI-KOT 8.6 MG TABLET: 8.6 mg | ORAL | Qty: 2

## 2021-01-06 MED FILL — ACETAMINOPHEN 500 MG TABLET: 500 mg | ORAL | Qty: 2

## 2021-01-06 NOTE — Plan of Care (Signed)
Problem: Activity Intolerance - Abdominal / GI / GU Surgery Patient - Adult  Goal: Improved activity tolerance ( return to admit level or baseline )  Outcome: Progress within 12 hours  Goal: Able to perform physical activity as ordered  Outcome: Progress within 12 hours     Problem: Airway, Ineffective - Abdominal / GI / GU Surgery Patient - Adult  Goal: Patent airway / effective airway clearance  Outcome: Progress within 12 hours     Problem: Anxiety, Patient / Family / Caregiver - Abdominal / GI / GU Surgery Patient - Adult  Goal: Alleviation of anxiety (Patient / Family / Caregiver)  Outcome: Progress within 12 hours  Goal: Expression of calm and reduction in anxiety symptoms  Outcome: Progress within 12 hours     Problem: Bleeding, at Risk or Actual - Abdominal / GI / GU Surgery Patient - Adult  Goal: Absence of active bleeding  Outcome: Progress within 12 hours     Problem: Deep Venous Thrombosis, Risk of - Abdominal / GI / GU Surgery Patient - Adult  Goal: Absence of deep venous thrombosis  Outcome: Progress within 12 hours     Problem: Fluid Volume, Imbalanced - Abdominal / GI / GU Surgery Patient - Adult  Goal: Absence of fluid imbalance signs and symptoms  Outcome: Progress within 12 hours     Problem: Gas Exchange, Impaired - Abdominal / GI / GU Surgery Patient - Adult  Goal: Adequate oxygenation (absence of signs and symptoms of hypoxemia)  Outcome: Progress within 12 hours  Goal: Adequate ventilation (absence of signs and symptoms of hypercarbia)  Outcome: Progress within 12 hours     Problem: GI Elimination Impaired - Abdominal / GI / GU Surgery Patient - Adult  Goal: Passage of soft, formed stool  Outcome: Progress within 12 hours  Goal: Stool elimination per clinical condition ( e.g. GI ostomies )  Outcome: Progress within 12 hours     Problem: Glycemic Control, Abnormal - Abdominal / GI / GU Surgery Patient - Adult  Goal: Maintain glucose levels within specified parameters  Outcome: Progress within  12 hours     Problem: GU Elimination Impaired - Abdominal / GI / GU Surgery Patient - Adult  Goal: Able to void spontaneously  Outcome: Progress within 12 hours  Goal: Able to perform urinary self-catheterization  Outcome: Progress within 12 hours  Goal: Able to perform urinary catheter / ostomy care  Outcome: Progress within 12 hours  Goal: Adequate urine output and no signs and symptoms of obstruction  Outcome: Progress within 12 hours     Problem: Infection, at Risk and Actual - Abdominal / GI / GU Surgery Patient - Adult  Goal: Prevention of infection  Outcome: Progress within 12 hours  Goal: Resolution of Infection  Outcome: Progress within 12 hours     Problem: Mobility, Impaired - Abdominal / GI / GU Surgery Patient - Adult  Goal: Able to perform exercises as instructed  Outcome: Progress within 12 hours  Goal: Able to participate in ordered Rehab therapy (Patient / Family / Caregiver)  Outcome: Progress within 12 hours  Goal: Able to participate in repositioning and/or transferring (Patient / Family / Caregiver)  Outcome: Progress within 12 hours  Goal: Demonstrates safety with mobility / transfers  Outcome: Progress within 12 hours  Goal: Able to use ambulatory assistive device appropriately  Outcome: Progress within 12 hours  Goal: Able to propel wheelchair with assistance as needed  Outcome: Progress within 12 hours  Problem: Nausea/Vomiting - Abdominal / GI / GU Surgery Patient - Adult  Goal: Absence of nausea and vomiting  Outcome: Progress within 12 hours     Problem: Nutrition, Alteration in - Abdominal / GI / GU Surgery Patient - Adult  Goal: Adequate nutritional intake  Outcome: Progress within 12 hours  Goal: Maximize nutritional intake per patient condition  Outcome: Progress within 12 hours     Problem: Pain,  Acute / Chronic - Abdominal / GI / GU Surgery Patient - Adult  Goal: Control of chronic pain  Outcome: Progress within 12 hours  Goal: Control of acute pain  Outcome: Progress within 12  hours  Goal: Involvement in pain management plan  Outcome: Progress within 12 hours     Problem: Sexual Dysfunction - Abdominal / GI / GU Surgery Patient - Adult  Goal: Knowledge of anatomical and sexual changes  Outcome: Progress within 12 hours     Problem: Skin Integrity, Impaired - Abdominal / GI / GU Surgery Patient - Adult  Goal: Skin / incision / wound healing  Outcome: Progress within 12 hours     Problem: Sleep Disturbance, at Risk or Actual - Abdominal / GI / GU Surgery Patient - Adult  Goal: Optimal balance of rest and activity demonstrated or reported  Outcome: Progress within 12 hours     Problem: Thrombosis, Risk of Central / Mid Line Related - Abdominal / GI / GU Surgery Patient - Adult  Goal: Absence of thrombosis  Outcome: Progress within 12 hours     Problem: Delirium - Adult / Pediatric  Goal: Absence or resolution of delirium  Outcome: Progress within 12 hours     Problem: Discharge Planning - Adult  Goal: Knowledge of and participation in plan of care  Outcome: Progress within 12 hours     Problem: Fall, at Risk or Actual - Adult  Goal: Absence of falls and fall related injury  Outcome: Progress within 12 hours  Goal: Knowledge of fall prevention  Outcome: Progress within 12 hours

## 2021-01-06 NOTE — Discharge Summary (Addendum)
[place] SUMMARY     Patient Name: Jacqueline Mclaughlin  Patient MRN: [id number]  Date of Birth: 1956/01/23    Facility: [facility]  Attending Physician: Wilma Flavin, MD      Date of Admission: 01/05/2021  Date of Discharge: 01/06/2021    Admission Diagnosis: Endometrioid adenocarcinoma  Discharge Diagnosis: Endometrial adenocarcinoma (CMS code)    Discharge Disposition: Home    History (with Chief Complaint)    Jacqueline Mclaughlin is a 65 y.o. 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 Pelvic washings. Uncomplicated. EBL 87ml.      Brief Hospital Course by Problem    Operative Findings: Patient underwent a  Ra-TLH, BSO. Pelvic washings. on 01/05/21.  Attending of record was Dr. Ovidio Hanger.     INTRAOPERATIVE FINDINGS:   Findings: globular uterus with leiomyoma, dense adhesions from omentum to anterior abdominal wall and from uterus to bladder and anterior abdominal wall. Normal appearing ovaries, appendix, liver surface.    Post-operative course was uncomplicated and patient was discharged on POD 1.    Path: Final path pending at time of discharge.    Pain: The patient's post-op pain was controlled with scheduled Tylenol, Ibuprofen scheduled and Oxycodone prn. She was discharged with a prescription for 5 tablets of Oxycodone 5mg .    Heme: The patient's pre-op Hct was 41.6. EBL 25cc. Post-op Hct was 36.7. She did not require any blood transfusions.  Her vital signs were within normal limits and she remained hemodynamically stable during her hospital stay. Patient was discharged with 14 days of Lovenox 40mg  daily for DVT prophylaxis as recommended per NCCN guidelines.     GU: A foley catheter was inserted prior to surgery. The patient had good urine output throughout her hospital stay. The Foley catheter was discontinued after the surgery and passed a voiding trial and voided without difficulty for the rest of  her stay.    GI: The patient was given a regular diet following surgery. She experienced some nausea and vomiting the night after her surgery the resolved on POD #1. Before she was discharged she tolerated a regular diet.     Onc: Patient will follow up with Dr. Hessie Dibble at her post-op appointment to discuss her final pathology and treatment plan.     Nutritional Assessment   NA    During this hospitalization the patient was treated for:  - Endometrial cancer   - Postoperative care    Physical Exam at Discharge  BP 121/48 (BP Location: Right upper arm, Patient Position: Lying)   Pulse 72   Temp 36.6 C (97.9 F) (Oral)   Resp 18   Ht 152 cm (4' 11.84")   Wt 74.8 kg (165 lb)   SpO2 96%   BMI 32.39 kg/m       Intake/Output Summary (Last 24 hours) at 01/06/2021 1227  Last data filed at 01/06/2021 1106  Gross per 24 hour   Intake 2588.25 ml   Output 2450 ml   Net 138.25 ml       Physical Exam  Constitutional:       Appearance: Normal appearance.   Eyes:      Extraocular Movements: Extraocular movements intact.      Pupils: Pupils are equal, round, and reactive to light.   Cardiovascular:      Rate and Rhythm: Normal rate and regular rhythm.      Pulses: Normal pulses.  Heart sounds: Normal heart sounds.   Pulmonary:      Effort: Pulmonary effort is normal.      Breath sounds: Normal breath sounds.   Abdominal:      General: Bowel sounds are normal. There is no distension.      Palpations: Abdomen is soft.      Tenderness: There is no abdominal tenderness. There is no guarding or rebound.      Comments: Port sites covered with Tegaderm. Clean/dry/intact without erythema or strike through.    Skin:     General: Skin is warm and dry.   Neurological:      Mental Status: She is alert.   Psychiatric:         Mood and Affect: Mood normal.         Behavior: Behavior normal.         Thought Content: Thought content normal.         Judgment: Judgment normal.       Relevant Labs, Radiology, and Other Studies  Recent Labs      01/06/21  0530 01/05/21  0616   WBC 10.5*  --    HGB 11.7*  --    HCT 36.7  --    PLT 228  --    NA 139  --    K 3.2* 3.3*   CL 105  --    CO2 25  --    BUN 12  --    CREAT 0.60  --    GLU 114  --    CA 8.3*  --    MG 2.3  --    PO4 3.4  --        Procedures Performed and Complications  - Ra-TLH, BSO. Pelvic washings. Uncomplicated. EBL 82ml.      DISCHARGE INSTRUCTIONS  See below     Discharge Diet  Regular Diet    Functional Assessment at Discharge/Activity Goals  No functional activity limits.    Allergies and Medications at Discharge    Allergies: Enalapril    Your Medications at the End of This Hospitalization             acetaminophen (TYLENOL) 500 mg tablet Take 2 tablets (1,000 mg total) by mouth every 8 (eight) hours for 10 days    atorvastatin (LIPITOR) 10 mg tablet Take 10 mg by mouth daily    chlorthalidone (HYGROTON) 25 mg tablet Take 25 mg by mouth every morning    cholecalciferol, vitamin D3, 1000 UNITS tablet Take 1,000 Units by mouth daily    enoxaparin (LOVENOX) 40 mg/0.4 mL injection Inject 0.4 mL (40 mg total) under the skin daily for 13 days    ibuprofen (ADVIL,MOTRIN) 600 mg tablet Take 1 tablet (600 mg total) by mouth every 8 (eight) hours as needed for Pain for up to 10 days Dosing 30mg -50mg /kg/24 hours divided every 8 hours (max 2.4G/day).    losartan (COZAAR) 50 mg tablet Take 50 mg by mouth every evening    naloxone 4 mg/actuation SPRAYNAERO 1 spray by Nasal route once as needed (suspected overdose) for up to 1 dose Call 911. Repeat if needed    omega-3 fatty acids 1,000 mg capsule Take 2 g by mouth daily    oxyCODONE (ROXICODONE) 5 mg tablet Take 1 tablet (5 mg total) by mouth every 4 (four) hours as needed for Pain    senna (SENOKOT) 8.6 mg tablet Take 1 tablet (8.6 mg total) by mouth 2 (two) times daily  Pending Tests   Pending Labs     Order Current Status    Type and Screen Collected (01/05/21 8101)    Cytology, Non-Gynecologic (deliver specimen to Pathology) In process     Surgical Pathology In process          Outside Follow-up  See below:     Booked [place] Appointments  Future Appointments   Date Time Provider [place]   01/24/2021 11:00 AM Wilma Flavin, MD [id number]   02/19/2021 11:00 AM Brigid Minna Antis, NP [id number]       Pending [place] Referrals  None    Discharge Assessment  Condition at discharge:  good  Final Discharge Disposition: Home or [place] Vaccines     Name Date    Alphonsa Overall (J[employer]J) Covid-19 Vaccine 01/12/2020    Manufacturer: Unknown Manufacturer    Lot: [id number]        Advance Care Planning Documentation during this hospitalization:    Code Status: [place] Documentation       Primary Care Physician  None Per Patient Provider  Address: No address on file   Phone: None  Fax: [phone]     Outside Providers, for pending tests please use the following numbers:   For [facility] - Please Call: [phone]    For [facility] - Please Call: [phone]   For [facility] - Please Call: [phone]    Signed,    Melba Coon, MD PGY-1  Obstetrics, Gynecology & [facility]  01/06/21          Discharge Instructions provided to the patient (if any):    Discharge Instructions    Discharge instructions following a laparoscopic surgery     How to Care for Yourself at [facility] may experience:   Pain in your upper back or shoulders from the gas used to enlarge your abdomen. The pain usually goes away in 5-7 days.    Light bleeding from the vagina.   Soreness, bruising, and mild swelling near the incisions.   Constipation   Feeling tired, especially for the first 24 hours you are home.    Activity   Take it easy for the rest of the day after you are discharged.    The first day after surgery, you can be active. Walking is a good choice. Each day, do a little more as you feel able.    Rest when you feel tired.   Do not drive while  taking narcotics (such as Oxycodone).   To prevent complications such as hernia, avoid lifting anything greater than 20 lbs or anything that would cause strain on the muscles in your abdomen.    Do not have vaginal intercourse or place anything in the vagina for six weeks after surgery or until your doctor tells you it is safe to do so. This includes tampons, a contraceptive ring, vaginal suppositories and creams.     Diet   You can resume your normal diet. If your stomach is upset, try bland, low-fat foods like plain rice, toast, and yogurt. Increasing fiber can help avoid constipation.   It is common to experience constipation right after surgery. The first bowel movement after surgery may be uncomfortable; try to avoid straining. Drink plenty of water, warm teas, and/or walk to help decrease the discomfort.    It is important  that you are passing gas daily. Continue to take your stool softeners/laxatives until your bowels return to normal. If you do not have a bowel movement in 2-3 days, add another laxative, such as Miralax which you can buy over-the-counter. If you are having loose stool, please stop all laxatives until loose stools resolve. Call the clinic at [phone] if you do not have a bowel movement within 3 days of surgery or have more than 3 loose stools in 24 hours.   Avoid alcohol for at least 24 hours after surgery and while on pain medications, including Tylenol (acetaminophen).    Medicines   Your health care provider will tell you if and when you can restart your home medicines. Read and follow all instructions on the label.   If your health care provider gave you a prescription medicine for pain, take it as prescribed.   Please call the help line [phone]) right away for increased abdominal pain or pain not controlled by medication.     Tylenol (acetaminophen) 500 mg tablets: Take 2 tablets (1,000 mg total) by mouth 3 times per day.  We recommend you use Tylenol around the clock  for at least 3 days following surgery and then use thereafter as needed for pain.   Motrin (ibuprofen) 600 mg tablets: Take one tablet 3 times per day. We recommend you use Motrin around the clock for at least 3 days following surgery and then use thereafter as needed for pain. Swallow ibuprofen tablets or capsules whole with a glass of water or juice. You should take ibuprofen tablets and capsules after a meal or snack or with a drink of milk. It will be less likely to upset your stomach. Do not chew, break, crush or suck them as this could irritate your mouth or throat. Risk of GI upset and/or GI bleed with ibuprofen. If you develop coffee ground emesis, blood in vomit or stool, black tarry stools, abdominal cramping or bloating, this could all be potential signs of GI bleeding. Please stop ibuprofen if you develop these symptoms and call our team.   Oxycodone 5 mg tablets: Take 1-2 tablets (5-10 mg total) by mouth every 6-8 hours for severe pain only. We don't anticipate you will need this. Please see below on how to get rid of opioids.    Senna 8.6 mg tablets: Take 2 tablets (17.2 mg total) by mouth nightly at bedtime. Do not take any medication for diarrhea.   Lovenox (enoxaparin) 40mg  syringe: Inject under the skin once daily for 13 days after surgery to prevent blood clots. * If this was prescribed, you or a family member will be taught how to give these injections before you go home. It is okay to continue Ibuprofen or Naprosyn (NSAIDs) while administering Lovenox. Please continue Lovenox for the full four weeks unless told to stop sooner by your surgical team.    HOW TO SAFELY DISPOSE OF UNUSED PRESCRIBED MEDICATIONS:     Option 1: Bring your pills to a drop-off kiosk. Go to this website for the nearest location ([email or web address]/map-search)     Option 2: Request a Mail-Back Envelope - Residents unable to travel to a drop-off or mail-back envelope distribution location may request a free,  prepaid envelope be sent to their home by calling [phone].     DO NOT flush medicine down the toilet or sink. DO NOT put medicine in the trash. DO NOT keep unused medicines lying around.    Incision Care   Please remove the bandages  covering your incisions 24 hours after surgery. They do not need to be replaced. Under these bandages you will have stitches, Steri-strip (paper tape closures), or skin glue. The Steri-strip closures will fall off by themselves in seven to ten days. The skin glue will also fall off in seven to ten days. Please do not pull the Steri-strips or skin glue off, allow them to come off themselves   Please shower 24 to 48 hours after surgery. Stand with your back to the shower head; wash with gentle soap and water. Avoid scrubbing. Pat the incision dry.    Avoid tub baths, hot tubs, and/or swimming until after your postoperative visit with your surgeon.   Wash the area daily with warm, soapy water, and pat it dry. Don't use hydrogen peroxide, ointments, creams, powders or alcohol as they can slow the healing process.   Leaving the incisions exposed to the air can promote healing.    You may cover the area with a gauze bandage if it oozes fluid or rubs against clothing. Change the bandage every day.    Other instructions   To reduce pain and keep swelling down, you may use an ice pack on your incision.  Avoid direct contact with your skin and limit use to 20 minutes at a time. Please do not use heat unless discussing with the doctors or nurses.   If you have any fevers, pain or nausea not controlled with oral medications, or any other symptoms concerning to you, please call [phone].    Follow-up care is a key part of your recovery. Please plan to attend your postoperative appointment 2 to 4 weeks after surgery. Please note that pathology results and treatment planning will be discussed at this visit. If you do not have a post-operative appointment, please call our office  to schedule one.      When should you call for help?    Call 911 anytime you think you may need emergency care. For example:   If you experience a temporary loss of consciousness (faint or pass out).   You have sudden chest pain and shortness of breath, or you cough up blood.    Call your doctor now or seek immediate medical care if:   You are having vaginal bleeding that soaks through a maxi pad every hour for 2 or more hours.   You have sudden, severe pain in your belly or pelvis.   You have loose stitches, or your incision comes open.   Red streaks leading from the incision.   Increased pain, swelling, warmth, or redness at the incision.   Pus draining from the incision.   A fever above 100.5 Fahrenheit or shaking chills.   Nausea or vomiting.   More than 3 episodes of diarrhea in a 24 hour period.            Patient Instructions    None

## 2021-01-06 NOTE — Progress Notes (Addendum)
[facility]  Progress Note - Gynecologic Oncology    ID:  65 y.o. 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 Pelvic washings. Uncomplicated. EBL 29ml. POD#1     Events  - 122ml emesis yesterday     Subjective  Feels better this AM. No abdominal pain. Had nausea with meals yesterday, emesis x2. Will try to have breakfast this AM and see how she does. Ambulated the halls and voided without issues. Okay with going home today with able to tolerate PO.     Scheduled Meds   0.9% sodium chloride flush  3 mL Intravenous Q8H [place]    acetaminophen  1,000 mg Oral Q8H [place]    atorvastatin  10 mg Oral Q PM [place]    enoxaparin  40 mg Subcutaneous Daily [place]    losartan  50 mg Oral Daily At Bedtime [facility]    senna  17.2 mg Oral Daily At Bedtime [facility]       PRN Meds   0.9% sodium chloride flush  3 mL Intravenous PRN    HYDROmorphone  0.2-0.8 mg Intravenous Q2H PRN    naloxone  0.1 mg Intravenous Q1 Min PRN    ondansetron  4 mg Oral Q6H PRN    Or    ondansetron  4 mg Intravenous Q6H PRN    oxyCODONE  5-10 mg Oral Q4H PRN    polyethylene glycol  17 g Oral Daily PRN    prochlorperazine  10 mg Intravenous Q6H PRN       Ins/out    Intake/Output Summary (Last 24 hours) at 01/06/2021 1043  Last data filed at 01/06/2021 0842  Gross per 24 hour   Intake 3588.25 ml   Output 2450 ml   Net 1138.25 ml       I/O  Timeline      10/11 0701  10/12 0700 10/12 0701  10/13 0700 10/13 0701  10/14 0700 10/14 0701  10/15 0700 10/15 0701  10/16 0700    P.O.    940 0    I.V. (mL/kg)    2648.25 (35.4)     Total Intake(mL/kg)    3588.25 (47.97) 0 (0)    Urine (mL/kg/hr)    2425 (1.35) 0 (0)    Emesis    0 0    Stool    0 0    Blood    25     Total Output(mL/kg)    2450 (32.75) 0 (0)    Net    +1138.25 0             Unmeasured Urine Occurrence     0 x    Unmeasured Stool Occurrence    0 x 0 x    Unmeasured Emesis Occurrence    101 x 0 x          PO: 400   UOP: 112 cc/hr last 12 hours     Vitals  Vitals    01/05/21  2123 01/05/21 2348 01/06/21 0435 01/06/21 0842   BP: 111/68 (!) 110/35 118/45 121/48   BP Location:  Right upper arm Right upper arm Right upper arm   Patient Position: Standing Lying Lying Lying   Pulse: 99 94 89 72   Resp:  18 18 18    Temp:  36.9 C (98.4 F) 36.7 C (98.1 F) 36.6 C (97.9 F)   TempSrc:  Oral Oral Oral   SpO2:  95% 94% 96%   Weight:  Height:              General: Well appearing. No acute distress.  CV: Regular rate. Normal peripheral perfusion.  Respiratory: Normal work of breathing  Abdomen: Soft. Mild tenderness to palpation.  Skin: Port site incisions c/d/i with dressing in place.  GU:  No vaginal bleeding.   Extremities: No peripheral edema, SCD in place  Neuro: Alert, oriented to person, place and time      Summary of Labs/Studies  Recent Labs     01/06/21  0530 01/05/21  0616   WBC 10.5*  --    HGB 11.7*  --    HCT 36.7  --    PLT 228  --    NA 139  --    K 3.2* 3.3*   CL 105  --    CO2 25  --    BUN 12  --    CREAT 0.60  --    GLU 114  --    CA 8.3*  --    MG 2.3  --    PO4 3.4  --         Imaging:  Radiology Results (last week)     ** No results found for the last 168 hours. **           DVT prophylaxis?  - Lovenox for 14 days starting 10/15 @ 0600     Assessment & Plan  65 y.o. 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 Pelvic washings. Uncomplicated. EBL 21ml. POD#1     Onc:  Abd pain minimal at this time.    - Pain: Well controlled on tylenol and ibuprofen.   - Intake:  Vomiting with PO yesterday evening.   - Output:  Voiding without issues   - Mobility: Ambulate/OOB TID as tolerated  - DVT PPx:  Lovenox 40mg  every day    - Path: Will f/u final path with Dr. [name] on 11/12      Heme/CV:  Stable. No signs/symptoms of active bleeding.  Pre-op Hgb 13.4 --> EBL 25cc --> POD1 Hgb []    - HTN: Hold PTA Losartan 50mg  at bedtime if BP <110, and Chlorthalidone 25mg  at bedtime   - HLD: continue PTA Atorvastatin 10mg   every day  - AM CBC stable   -  AM BMP stable      GU:  Foley out, voiding without issues.      GI:  Nauseous with regular diet   - Zofran ordered prn  - Regular diet   - F/u lytes now and replete prn      Respiratory:  No active issues   - Encourage ICS     ID:  No active issues.     Dispo: Home today pending tolerates breakfast     Inpatient Checklist   Code status: Full   Isolation precautions: Not indicated   Monitoring: None indicated   Diet: Regular    DVT prophylaxis: Chemical and SCDs   Bowel regimen: Standing   Delirium bundle: Not indicated   Disposition: Home when meeting all postoperative goals       Patient is currently being treated for the following conditions:  - Post op     Code Status: Full       D/w Dr. Janalee Dane     Melba Coon, MD PGY-1  Obstetrics, Gynecology & [facility]  01/06/21

## 2021-01-12 ENCOUNTER — Encounter: Payer: MEDICARE | Primary: Physician

## 2021-01-12 DIAGNOSIS — C541 Malignant neoplasm of endometrium: Secondary | ICD-10-CM

## 2021-01-24 ENCOUNTER — Ambulatory Visit: Admit: 2021-01-24 | Discharge: 2021-02-03 | Payer: MEDICARE | Attending: Gynecologic Oncology | Primary: Physician

## 2021-01-24 DIAGNOSIS — C541 Malignant neoplasm of endometrium: Secondary | ICD-10-CM

## 2021-01-24 NOTE — Addendum Note (Signed)
Addended by: Ovidio Hanger on: 02/03/2021 10:56 AM     Modules accepted: Level of Service

## 2021-01-24 NOTE — Progress Notes (Addendum)
Subjective    Jacqueline Mclaughlin is a 65 y.o. female who presents with the following:    Chief Complaint            Malignant neoplasm of uterus, unspecified site (CMS code)     Lung Cancer         Interpreter: Available -  Name and/or ID: [phone]  Interpreter Source: Telephone Interpreter  Language: Spanish    History of Present Illness    65 y.o. 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 Pelvic washings. Uncomplicated. EBL 37m.     8/21:   - First episode of PMB. TVUS and pap smear normal.  -TVUS:Normal endometrial thickness of 3 mm.Several intramural fibroids, largest 3.0 cm in the fundus. 1.4 cm simple right ovarian cyst.    12/21:   - TVUS and EMB normal    07/11/20:  - Presents to ED for intermittent abdominal pain and spotting since August 2021  -TVUS: subcentimeter lesion in cervical canal, endometrial thickness 6 mm, 2 small fibroids  -CEA is 0.5. CA125 is 16.Plactin normal.    07/19/20:   - [place]. Continued bleeding since August (lasts up to 5 days, light to medium flow, cramping). Found to have a cervical lesion on exam. EMB performed.    07/21/20:   - Endometrial biopsy:Extremely scant benign fragmented surface endometrial glandular epithelium. Benign endocervical glandular tissue with microglandular hyperplasia. No hyperplasia, dysplasia or malignancy.  -Endocervix, biopsy: Benign endocervical glandular tissue. Benign ectocervical squamous epithelium. No dysplasia    09/12/20:   - Continued intermittent bleeding. PCP recommended repeat [place] Patient sought GYN input. Scheduled forPelvic exam under anesthesia, cervical dilation, hysteroscopy, endocervical and endometrial curettage, possible hysteroscopic polypectomy and/or myomectomy    10/27/20:  - Pelvic EUA,hysteroscopy D&C, polypectomy   -Uterus, endometrium, curettage:Endometrioid adenocarcinoma,  FIGO grade 1-2. Sections of the endometrium curettage and polypectomy demonstrate an adenocarcinoma with tubular, cribriform and focal solid (5-10%) growth pattern. The tumor cells show moderate pleomorphism with enlarged hyperchromatic nuclei and small to medium amount of eosinophilic cytoplasm. Fragments of benign endometrial polyps are also noted. Immunostains are performed on tissue block A2 as follows:P53: Wild-typepattern, ER: Positive, diffuse and strong, Napsin A: Mostly negative.    MLH1 (M1): Positive, intact nuclear staining  PMS2 (A16-4): Positive, intact nuclear staining  MSH2 (([id number]: Negative, loss of nuclear staining  MSH6 ((NP00: Positive, intact nuclear staining    Loss of nuclear staining in MSH2 only. This pattern is unusual, and may represent heterogeneity of tumor in the limited biopsy material. Repeat mismatch repair proteins testing in final excision is recommended to further rule out possible Lynch syndrome.    - Uterus, endocervix, curettage: mostly mucoid material. Scant benign endocervical glands.    11/06/20: [facility] of chest abdomen pelvis w/ contrast, s/p hysteroscopy:Unremarkable chest CT.Myomatous uterus with interval reduction in the size of the endometrial cavity. No abnormallymphadenopathy or peritoneal spread identified.Colonic diverticulosis    01/05/21  Ra-TLH, BSO.      Today, 01/24/2021, she reports that she feels overall well.   Wants to know if her stitches are going to come out on their own. Can feel them under her skin.    Denies vaginal bleeding or abdominal pain that is not alleviated by OTC meds. Denies n/v or fevers.             Objective  Vitals    Flowsheet Row Most Recent Value   Pain Score 0            Physical Exam  Limited by VV  Gen: well appearing  CV: grossly well perfused  Pulm: easy work of breathing  Abd: well healing laparoscopic incisions       Review of Prior Testing    01/05/21 Hysterectomy  Pathology  FINAL PATHOLOGIC DIAGNOSIS    Uterus with adnexa, hysterectomy and bilateral salpingo-oophorectomy:  - Endometrium: Endometrioid carcinoma, grade 1, invading inner  myometrium.  - Myometrium:   1. 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  2. Leiomyomas.  - Cervix: Nabothian cysts.  - Serosa: Adhesions.  - Left fallopian tube: Paratubal cysts.  - Left ovary: Surface adhesions.  - Right fallopian tube: No significant pathologic abnormality.   - Right ovary: Cystic corpus luteum.        COMMENT:   Endometrial Cancer Synoptic Report    Procedures:       -Hysterectomy: Total hysterectomy.  -Ovaries: Bilateral oophorectomies.   -Fallopian tubes: Bilateral salpingectomies.  -Omentectomy: Not performed.  -Peritoneal sampling: Not performed.  -Pelvic washings: Performed.  -Vaginal cuff resection:  Not performed.  Histologic type: Endometrioid carcinoma.  Histologic grade: FIGO grade 1.   Myometrial invasion by tumor: Present.       - Depth of invasion: 0.6 cm (measured on slide A5).  - Thickness of myometrium:  1.4 cm.  - Percent of myometrium invaded:  43%.  Cervical stromal invasion by tumor: None.  Uterine serosal involvement by tumor: None.  Other organ involvement by tumor: None.  Lymphatic/vascular invasion: None.  Tumor size: <1 cm x <1 cm surface area; 0.6 cm thickness.  Pelvic lymph nodes:   -Total number (including sentinel nodes) examined:  0.        Para-aortic lymph nodes:   -Total number (including sentinel nodes) examined: 0.         Ectocervical / vaginal cuff margin: Negative.  Paracervical / parametrial margin: Negative.  Peritoneal / ascitic fluid cytology: Benign.  Distant metastasis: None.  AJCC (8th edition) pathologic stage:  -pT: 1a.  -pN: X.  -pM: X.   FIGO stage: IA.         Assessment and Plan              65 y.o. 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 Pelvic washings. Uncomplicated. EBL 98m.  Doing well postoperatively with no need for further treatment.     # FIGO grade 1 endometrioid adenocarcinoma   The patient's pathology shows no LVSI, has only grade I histology, \an no invasion into the outer half of the myometrium. No further treatment needed at this time. Given her small risk of recurrence most likely in the vaginal cuff will begin surveillance with pelvic exams     We discussed that she will start surveillance next month.    We discussed that [place], or [place] pain.    [ ] return for vaginal cuff check in 1 month   [ ] q4 month checks       I performed this evaluation using real-time telehealth tools, including a live video Zoom connection between my location and the patient's location. Prior to initiating, the patient consented to perform this evaluation using telehealth tools. The location of the attending physician is in a [facility] clinical facility.  Discussed with Dr. Kerrie Buffalo MD, MPH PGY-2  OBGYN & RS Department - [place]  01/24/21

## 2021-02-08 ENCOUNTER — Encounter: Payer: MEDICARE | Primary: Physician

## 2021-02-08 DIAGNOSIS — C541 Malignant neoplasm of endometrium: Secondary | ICD-10-CM

## 2021-04-11 ENCOUNTER — Ambulatory Visit: Admit: 2021-04-11 | Payer: MEDICARE | Attending: Gynecologic Oncology | Primary: Physician

## 2021-04-11 DIAGNOSIS — C541 Malignant neoplasm of endometrium: Secondary | ICD-10-CM

## 2021-04-11 NOTE — Addendum Note (Signed)
Addended by: Ovidio Hanger on: 04/12/2021 05:15 PM     Modules accepted: Level of Service

## 2021-04-11 NOTE — Patient Instructions (Addendum)
Genetics: [phone]  Llame para arreglar [facility]    Colonoscopia:  [phone].    Es muy importante que arregle una colonoscopia.

## 2021-04-11 NOTE — Progress Notes (Signed)
[facility]  Follow-Up Visit     ID: 66 y.o. female [phone] with Stage 1AFIGO grade 1, MMR-D endometrioid adenocarcinoma s/p RA-TLH, BSO 01/05/21, presenting for in-person follow-up.     Subjective    Jacqueline Mclaughlin is a 66 y.o. female who presents   Chief Complaint            Follow-up     Endometrial adenocarcinoma          Interpreter: Available -  Name and/or ID: [name]  Interpreter Source: Telephone Interpreter  Language: Spanish    History of Present Illness   66 y.o. female [phone] with Stage 1AFIGO grade 1, MMR-D endometrioid adenocarcinoma s/p RA-TLH, BSO 01/05/21, presenting for in-person follow-up.     8/21:   - First episode of PMB. TVUS and pap smear normal.  -TVUS:Normal endometrial thickness of 3 mm.Several intramural fibroids, largest 3.0 cm in the fundus. 1.4 cm simple right ovarian cyst.    12/21:   - TVUS and EMB normal    07/11/20:  - Presents to ED for intermittent abdominal pain and spotting since August 2021  -TVUS: subcentimeter lesion in cervical canal, endometrial thickness 6 mm, 2 small fibroids  -CEA is 0.5. CA125 is 16.Plactin normal.    07/19/20:   - Referred to [place] for continued PMB and pelvic pain. Continued bleeding since August (lasts up to 5 days, light to medium flow, cramping). Found to have a cervical lesion on exam. EMB performed.    07/21/20:   - Endometrial biopsy:Extremely scant benign fragmented surface endometrial glandular epithelium. Benign endocervical glandular tissue with microglandular hyperplasia. No hyperplasia, dysplasia or malignancy.  -Endocervix, biopsy: Benign endocervical glandular tissue. Benign ectocervical squamous epithelium. No dysplasia    09/12/20:   - Continued intermittent bleeding. PCP recommended repeat US. Patient sought GYN input. Scheduled forPelvic exam under anesthesia, cervical dilation, hysteroscopy, endocervical and endometrial curettage, possible hysteroscopic polypectomy and/or  myomectomy    10/27/20:  - Pelvic EUA,hysteroscopy D&C, polypectomy   -Uterus, endometrium, curettage:Endometrioid adenocarcinoma, FIGO grade 1-2. Sections of the endometrium curettage and polypectomy demonstrate an adenocarcinoma with tubular, cribriform and focal solid (5-10%) growth pattern. The tumor cells show moderate pleomorphism with enlarged hyperchromatic nuclei and small to medium amount of eosinophilic cytoplasm. Fragments of benign endometrial polyps are also noted. Immunostains are performed on tissue block A2 as follows:P53: Wild-typepattern, ER: Positive, diffuse and strong, Napsin A: Mostly negative.    MLH1 (M1): Positive, intact nuclear staining  PMS2 (A16-4): Positive, intact nuclear staining  MSH2 ([id number]): Negative, loss of nuclear staining  MSH6 (HY07): Positive, intact nuclear staining    Loss of nuclear staining in MSH2 only. This pattern is unusual, and may represent heterogeneity of tumor in the limited biopsy material. Repeat mismatch repair proteins testing in final excision is recommended to further rule out possible Lynch syndrome.    - Uterus, endocervix, curettage: mostly mucoid material. Scant benign endocervical glands.    11/06/20: [place] CT of chest abdomen pelvis w/ contrast, s/p hysteroscopy:Unremarkable chest CT.Myomatous uterus with interval reduction in the size of the endometrial cavity. No abnormallymphadenopathy or peritoneal spread identified.Colonic diverticulosis    01/05/21  RA-TLH, BSO, pelvic washings, uncomplicated. Pathology with Stage 1A Grade 1 endometrioid carcinoma, 43% myometrial invasion, no LVSI. Washings benign. Negative for MLH1 promoter methylation.      01/24/21 Video visit with Dr. [name], doing well post-operatively.     Today, 04/11/21, patient reports feeling well overall.  She missed two in-person appointments in November due to challenges getting transportation to [facility]. She reports  very intermittent "shock-like" abdominal pain at incision, self-resolves spontaneously. No persistent abdominal or pelvic pain. No vaginal bleeding or vaginal discharge. No bloating or early satiety. No chest pain or shortness of breath. No fevers or chills.    Clarified patient's family history with her given her MMR-deficient tumor; patient's mother had gastric cancer and maternal aunt had pancreatic cancer. No other known family history of cancer. Has never had a colonoscopy.     Objective      Vitals    Flowsheet Row Most Recent Value   BP 143/101  [2nd attempt]   Pulse 66   *Resp 20   Temp 35.7 C (96.3 F)   Temp src Temporal   SpO2 98 %   Weight 78 kg (171 lb 14.4 oz)   Height 153.5 cm (5' 0.43")  [copied 8/22 '@cc'  KL]   BMI (Calculated) 33.2        Physical exam  Gen: well appearing, NAD  HEENT: no supraclavicular LAD  CV: regular rate and rhythm   Pulm: anterior lung fields CTAB  Abd: well healing laparoscopic incisions. Soft, non-distended, non-tender. No palpable masses  Pelvic: Speculum exam with normal vaginal tissue, cuff visualized without lesions or defects. BME with in tact vaginal cuff, no masses palpated. Rectovaginal exam smooth.   Ext: symmetric, non-edematous     Pathology    01/05/21 Hysterectomy Pathology  FINAL PATHOLOGIC DIAGNOSIS    Uterus with adnexa, hysterectomy and bilateral salpingo-oophorectomy:  - Endometrium: Endometrioid carcinoma, grade 1, invading inner  myometrium.  - Myometrium:   1. 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  2. Leiomyomas.  - Cervix: Nabothian cysts.  - Serosa: Adhesions.  - Left fallopian tube: Paratubal cysts.  - Left ovary: Surface adhesions.  - Right fallopian tube: No significant pathologic abnormality.   - Right ovary: Cystic corpus luteum.      COMMENT:   Endometrial Cancer Synoptic Report    Procedures:       -Hysterectomy: Total hysterectomy.  -Ovaries: Bilateral oophorectomies.   -Fallopian tubes: Bilateral salpingectomies.  -Omentectomy: Not  performed.  -Peritoneal sampling: Not performed.  -Pelvic washings: Performed.  -Vaginal cuff resection:  Not performed.  Histologic type: Endometrioid carcinoma.  Histologic grade: FIGO grade 1.   Myometrial invasion by tumor: Present.       - Depth of invasion: 0.6 cm (measured on slide A5).  - Thickness of myometrium:  1.4 cm.  - Percent of myometrium invaded:  43%.  Cervical stromal invasion by tumor: None.  Uterine serosal involvement by tumor: None.  Other organ involvement by tumor: None.  Lymphatic/vascular invasion: None.  Tumor size: <1 cm x <1 cm surface area; 0.6 cm thickness.  Pelvic lymph nodes:   -Total number (including sentinel nodes) examined:  0.        Para-aortic lymph nodes:   -Total number (including sentinel nodes) examined: 0.         Ectocervical / vaginal cuff margin: Negative.  Paracervical / parametrial margin: Negative.  Peritoneal / ascitic fluid cytology: Benign.  Distant metastasis: None.  AJCC (8th edition) pathologic stage:  -pT: 1a.  -pN: X.  -pM: X.   FIGO stage: IA.      Assessment and Plan      66 y.o. female [id number] Stage 1AFIGO grade 1, MMR-D endometrioid adenocarcinoma s/p RA-TLH, BSO 01/05/21, undergoing surveillance and presenting for in-person follow-up.     #  Stage 1A FIGO grade 1, MMR-D endometrioid adenocarcinoma     I discussed my findings and impressions with the patient. She is 66 y.o. and has FIGO grade 1, stage 1A disease with <50% myometrial invasion, no LVSI.  She has no high-risk features as defined by GOG and one high-risk feature (age) as defined by [place]. As such, no indication for adjuvant radiation and would recommend surveillance.     Discussed with patient mismatch repair deficient pathology findings, and our recommendation for referral to Cancer Genetics for further workup. Also reviewed importance of colon cancer screening; patient has never had a colonoscopy before. Placed referral to GI for colonoscopy.     '[ ]'  continue surveillance visits q58mo   '[ ]'  referral to Cancer Genetics   '[ ]'  referral to GI for colonoscopy placed    RTC in [place] surveillance visit     Discussed with Gyn Onc attending physician Dr. AlPhilbert Riserwho formulated the plan and led the discussion     GrDessie ComaD PGY-1  OBGYN & RS Department  04/11/21

## 2021-04-16 MED ORDER — PEG 3350-ELECTROLYTES 236 GRAM-22.74 GRAM-6.74 GRAM-5.86 GRAM SOLUTION
Freq: Once | ORAL | 0 refills | Status: AC
Start: 2021-04-16 — End: 2021-04-16

## 2021-04-16 MED ORDER — SIMETHICONE 125 MG CHEWABLE TABLET
125 | ORAL_TABLET | ORAL | 0 refills | Status: AC
Start: 2021-04-16 — End: ?

## 2021-04-16 MED ORDER — ONDANSETRON HCL 8 MG TABLET
8 | ORAL_TABLET | ORAL | 0 refills | Status: AC
Start: 2021-04-16 — End: ?

## 2021-05-02 NOTE — Nursing Note (Signed)
Spanish interpreter ID# [phone] Rica Mote utilized to contact pt.     Spoke to pt and confirmed procedure  (colonoscopy) date-05/09/21 and arrival time-0930/[facility]-location/NPO status/driver required/ bowel prep solution/meds received.     Instructed pt to adhere to clear liquid diet all day 05/08/21     ( AVOID drinks that are colored purple and red. Clear liquids include: water, clear juices (with no pulp), clear broth (beef, vegetable or chicken), coconut water, Gatorade, coffee or tea without milk, green/yellow jello. Honey and sugar are OK.)      Advised pt to start drinking 1st half of prep at 6 pm 05/08/21 and 2nd half of prep 6 hours prior to arrival time 05/09/21. Drink 8 oz of prep solution every 15- 30 minutes as tolerated.   Zofran can be taken for nausea PRN. Advised pt to [facility] and swallow simethicone tablets (for best results, we recommend a total of about 400 mg which is typically 3-4 tablets with 1st half of prep and repeat with 2nd half of prep. Nothing by mouth 2 hours prior to arrival time this includes gum and candy. Pt verbalized understanding.     Pre-op COVID-19 testing is no longer required.  Patient denies recent covid19 exposure, infection or symptoms in the last 10 days.     No driving the day of procedure. Ok to resume driving the following day

## 2021-05-09 MED ORDER — SODIUM CHLORIDE 0.9 % INTRAVENOUS SOLUTION
0.9 | INTRAVENOUS | Status: DC
Start: 2021-05-09 — End: 2021-05-09
  Administered 2021-05-09: 18:00:00 via INTRAVENOUS

## 2021-05-09 MED ORDER — SIMETHICONE 40 MG/0.6 ML ORAL DROPS,SUSPENSION
40 | ORAL | Status: DC
Start: 2021-05-09 — End: 2021-05-09

## 2021-05-09 MED ORDER — MIDAZOLAM (PF) 1 MG/ML INJECTION SOLUTION
1 | INTRAMUSCULAR | Status: DC
Start: 2021-05-09 — End: 2021-05-09
  Administered 2021-05-09: 18:00:00 2 mg via INTRAVENOUS

## 2021-05-09 MED ORDER — LIDOCAINE (PF) 10 MG/ML (1 %) INJECTION SOLUTION
10 | INTRAMUSCULAR | Status: DC
Start: 2021-05-09 — End: 2021-05-09

## 2021-05-09 MED ORDER — MEPERIDINE (PF) 25 MG/ML INJECTION SOLUTION
25 | INTRAMUSCULAR | Status: DC
Start: 2021-05-09 — End: 2021-05-09
  Administered 2021-05-09: 18:00:00 25 mg via INTRAVENOUS

## 2021-05-09 MED ORDER — ONDANSETRON HCL (PF) 4 MG/2 ML INJECTION SOLUTION
4 | Freq: Once | INTRAMUSCULAR | Status: AC
Start: 2021-05-09 — End: 2021-05-09
  Administered 2021-05-09: 18:00:00 4 mg via INTRAVENOUS

## 2021-05-09 MED FILL — MEPERIDINE (PF) 25 MG/ML INJECTION SOLUTION: 25 mg/mL | INTRAMUSCULAR | Qty: 1

## 2021-05-09 MED FILL — MIDAZOLAM (PF) 1 MG/ML INJECTION SOLUTION: 1 mg/mL | INTRAMUSCULAR | Qty: 5

## 2021-05-09 MED FILL — SIMETHICONE 40 MG/0.6 ML ORAL DROPS,SUSPENSION: 40 mg/0.6 mL | ORAL | Qty: 0.6

## 2021-05-09 MED FILL — ONDANSETRON HCL (PF) 4 MG/2 ML INJECTION SOLUTION: 4 mg/2 mL | INTRAMUSCULAR | Qty: 2

## 2021-05-09 NOTE — Discharge Instructions (Addendum)
If you have been given medications to sedate you for your procedure(s), you must have an adult to accompany you home.  These medications will impair your ability to drive, so you cannot drive until the day after the procedure.    Diet:  Regular diet unless specified otherwise.  Eat a small first meal of any foods that you know your body tolerates well.    Activities:  Resume your regular activities tomorrow unless specified otherwise.    Today you received medications that made you sleepy during your procedure.    The following items are recommended for your care during the next 24 hrs.  Do NOT drive or operate heavy machinery until tomorrow.  Do not consume alcohol for 24 hrs.  Do not make important decisions for 24 hrs.  Rest the remainder of the day.    Procedure Discharge Instructions:   COLONOSCOPY   You may experience cramping, bloating, or flatulence (gas).  If your doctor took biopsies, your first bowel movements may show a small amount of blood (a few tablespoons). This is normal. Notify your doctor if the amount of blood appears to be more.  Notify your doctor if you:    Pass bloody or black stool.  Have constant abdominal pain.  Have a temperature over 101 degrees orally.    Medications:   You may resume taking your regularly scheduled and over-the-counter medications today.      Other Instructions: see report  If a biopsy was taken or a polyp was removed, please contact the doctor for results in 7-10 days at the number below or on MyChart.    If anything concerns you, please call your physician, Waseem Ahmad [phone]  If you are unable to reach your doctor, call [facility] at [phone] and ask for the GI Fellow on call.

## 2021-05-09 NOTE — H&P (Signed)
Procedure Planned: Colonoscopy  Diagnosis: Screening    Chief Complaint: Screening    Past Medical History:   Diagnosis Date    Anesthesia Narrative     remote hx prolonged sedation per pt    Bilateral presbyopia     Cervical polyp     Cortical cataract of both eyes     Depression     Essential hypertension     GERD (gastroesophageal reflux disease)     Hematuria     Hyperlipidemia     Postpartum cardiomyopathy     Pre-diabetes     Tension headache     Thyroid nodule      Past Surgical History:   Procedure Laterality Date    CESAREAN SECTION  1990    CESAREAN SECTION  2001    HYSTEROSCOPY  10/27/2020    OVARIAN CYST REMOVAL      THYROID BIOPSY  11/13/2015    TUBAL LIGATION       Allergies/Contraindications   Allergen Reactions    Enalapril Cough      No medications prior to admission.       Social History  She   reports that she has never smoked. She has never used smokeless tobacco. She reports that she does not drink alcohol and does not use drugs.   Family History  family history includes Pancreatic cancer in her maternal aunt; Stomach cancer in her mother.   Family history is otherwise negative or as noted above.      Physical Exam:  Vss:  There were no vitals taken for this visit.   Wt Readings from Last 3 Encounters:   04/11/21 78 kg (171 lb 14.4 oz)   01/05/21 74.8 kg (165 lb)   12/28/20 74.8 kg (165 lb)      Mallampati Score: 2  ASA Classification: 2  Constitutional: Well-appearing.  No acute distress.  Eyes: Normal eyelids and conjunctivae, pupils equal round and reactive to light  Ears, Nose, Mouth, Throat: Atraumatic, normocephalic, normal lips, teeth, and gum, moist mucous membranes  Neck:  Neck supple, no lymphadenopathy  Respiratory:  Normal respiratory effort, no accessory muscle use or intercostal retraction, no dullness to percussion, clear to auscultation bilaterally  Cardiovascular:  Regular rate and rhythm, normal s1/s2, no lower extremity edema  Gastrointestinal:  Soft,  nontender, nondistended, no masses, no hepatosplenomegaly, no guarding, no rebound  Hem/Lymphatic: No lymphadenopathy of neck or axillae  Muskuloskeletal: No clubbing or cyanosis of hands.    Skin:  No rashes, ulcers or lesions.  No nodules, scaling or induration.  Neurologic:  Gait normal  Psychiatric: Oriented to time, place, and person.  Normal mood and affect.     I have personally reviewed the following blood tests listed below:     Latest [facility] Labs:  WBC Count   Date Value Ref Range Status   01/06/2021 10.5 (H) 3.4 - 10.0 x10E9/L Final     RBC Count   Date Value Ref Range Status   01/06/2021 4.35 4.00 - 5.20 x10E12/L Final     Hemoglobin   Date Value Ref Range Status   01/06/2021 11.7 (L) 12.0 - 15.5 g/dL Final     Hematocrit   Date Value Ref Range Status   01/06/2021 36.7 36.0 - 46.0 % Final     MCV   Date Value Ref Range Status   01/06/2021 84 80 - 100 fL Final     MCH   Date Value Ref Range Status   01/06/2021 26.9  26.0 - 34.0 pg Final     MCHC   Date Value Ref Range Status   01/06/2021 31.9 31.0 - 36.0 g/dL Final     Platelet Count   Date Value Ref Range Status   01/06/2021 228 140 - 450 x10E9/L Final     No results found for: ALT, AST, ALKP, DBILI, TBILI, ANA4, TBILWB, GGT, GGTEXL, GGTEXQ, ANA5   No results found for: AMY  No results found for: LIPA  No results found for: CRP, CRPEXL, CRPEXQ       I have reviewed the studies below     Latest imaging and other diagnostics:       Last Colonoscopy:    _______________________________  ASSESSMENT AND PLAN: 70F with endometrial cancer s/p TAH/BSO (12/2020) here for screening colonoscopy    Plan for: Moderate Sedation    Immediate Pre-Sedation Assessment Completed including response to Pre-Medication.    Airway Status Unchanged - Cleared for Sedation and Procedure    DOCUMENTATION OF INFORMED CONSENT  I have discussed the risks, benefits, and alternatives of the procedure and sedation with the patient and/or the patient's medical decision-maker.  This discussion  included, but was not limited to, the risk of bleeding, infection, damage to anatomical structures, need for reoperation, or even death.  The patient and/or the patient's medical decision maker understands, has had all of his/her questions answered, and desires to proceed.  Informed consent obtained.

## 2021-10-10 ENCOUNTER — Ambulatory Visit: Admit: 2021-10-10 | Discharge: 2021-10-19 | Payer: MEDICARE | Attending: Gynecologic Oncology | Primary: Physician

## 2021-10-10 DIAGNOSIS — C541 Malignant neoplasm of endometrium: Secondary | ICD-10-CM

## 2021-10-10 NOTE — Addendum Note (Signed)
Addended by: Ovidio Hanger on: 10/19/2021 03:14 PM     Modules accepted: Level of Service

## 2021-10-10 NOTE — Progress Notes (Signed)
[facility]  Follow-Up Visit     ID: 66 y.o. female [phone] with Stage 1A FIGO grade 1, MMR-D endometrioid adenocarcinoma s/p RA-TLH, BSO 01/05/21, presenting for in-person follow-up.     Subjective    Jacqueline Mclaughlin is a 66 y.o. female who presents:   Chief Complaint              Follow-up     Endometrial adenocarcinoma           Interpreter: Available -  Name and/or ID: [name]  Interpreter Source: Telephone Interpreter  Language: Spanish    History of Present Illness   66 y.o. female [phone] with Stage 1A FIGO grade 1, MMR-D endometrioid adenocarcinoma s/p RA-TLH, BSO 01/05/21, presenting for in-person follow-up.     8/21:   - First episode of PMB. TVUS and pap smear normal.   -TVUS: Normal endometrial thickness of 3 mm. Several intramural fibroids, largest 3.0 cm in the fundus. 1.4 cm simple right ovarian cyst.     12/21:   - TVUS and EMB normal     07/11/20:  - Presents to ED for intermittent abdominal pain and spotting since August 2021  -TVUS: subcentimeter lesion in cervical canal, endometrial thickness 6 mm, 2 small fibroids  -CEA is 0.5. CA125 is 16. Plactin normal.     07/19/20:   - Referred to [facility] for continued PMB and pelvic pain. Continued bleeding since August (lasts up to 5 days, light to medium flow, cramping). Found to have a cervical lesion on exam. EMB performed.     07/21/20:   - Endometrial biopsy: Extremely scant benign fragmented surface endometrial glandular epithelium. Benign endocervical glandular tissue with microglandular hyperplasia. No hyperplasia, dysplasia or malignancy.  - Endocervix, biopsy: Benign endocervical glandular tissue. Benign ectocervical squamous epithelium. No dysplasia     09/12/20:   - Continued intermittent bleeding. PCP recommended repeat US. Patient sought GYN input. Scheduled for Pelvic exam under anesthesia, cervical dilation, hysteroscopy, endocervical and endometrial curettage, possible hysteroscopic polypectomy and/or  myomectomy     10/27/20:  - Pelvic EUA,  hysteroscopy D&C, polypectomy   - Uterus, endometrium, curettage: Endometrioid adenocarcinoma, FIGO grade 1-2. Sections of the endometrium curettage and polypectomy demonstrate an adenocarcinoma with tubular, cribriform and focal solid (5-10%) growth pattern. The tumor cells show moderate pleomorphism with enlarged hyperchromatic nuclei and small to medium amount of eosinophilic cytoplasm. Fragments of benign endometrial polyps are also noted. Immunostains are performed on tissue block A2 as follows: P53: Wild-type pattern, ER: Positive, diffuse and strong, Napsin A: Mostly negative.      MLH1 (M1): Positive, intact nuclear staining  PMS2 (A16-4): Positive, intact nuclear staining  MSH2 ([id number]): Negative, loss of nuclear staining  MSH6 (QI69): Positive, intact nuclear staining     Loss of nuclear staining in MSH2 only. This pattern is unusual, and may represent heterogeneity of tumor in the limited biopsy material. Repeat mismatch repair proteins testing in final excision is recommended to further rule out possible Lynch syndrome.  - Uterus, endocervix, curettage: mostly mucoid material. Scant benign endocervical glands.     11/06/20: [place] CT of chest abdomen pelvis w/ contrast, s/p hysteroscopy:  Unremarkable chest CT.Myomatous uterus with interval reduction in the size of the endometrial cavity. No abnormal lymphadenopathy or peritoneal spread identified.Colonic diverticulosis    01/05/21  RA-TLH, BSO, pelvic washings, uncomplicated. Pathology with Stage 1A Grade 1 endometrioid carcinoma, 43% myometrial invasion, no LVSI. Washings benign. Negative  for MLH1 promoter methylation.      01/24/21 Video visit with Dr. [name], doing well post-operatively.     Last seen on 04/11/21, patient was doing well. Referred [facility] for [facility]oscopy.    On 05/10/2021, colonoscopy notable for one transverse and one descending colon  polyps.     Today, 10/10/21, Doing well since last visit.  No VB, abd pain, bloating, n/v, appetite changes, early satiety, bladder or bowel changes, fatigue, fevers/chills/night sweats. Endorses some weight loss since surgery (180lbs preop > 165lbs today) iso increased exercise.  Since last visit, she had a colonoscopy that revealed two 3-mm polyps. Pathology report indicates both are benign.      Objective      Vitals      Flowsheet Row Most Recent Value   BP 143/68  [no headaches or dizzy pt did not take bp meds today]   Pulse 68   *Resp 16   Temp 35.8 C (96.4 F)   Temp src Temporal   SpO2 99 %   Weight 76.9 kg (169 lb 8 oz)   Height 162.6 cm (5' 4.02")  [04/2021 @ cc VF W/PT]   Pain Score 0   BMI (Calculated) 29.1        Measurements  BP: (!) 143/68 (no headaches or dizzy pt did not take bp meds today)  Pulse: 68  Temp: (!) 35.8 C (96.4 F)  Height: 162.6 cm (5' 4.02") (04/2021 @ cc VF W/PT)  Weight: 76.9 kg (169 lb 8 oz)  BMI (Calculated): 29.1      Physical exam  Gen: well appearing, NAD  HEENT: no supraclavicular LAD  CV: regular rate and rhythm   Pulm: anterior lung fields CTAB  Abd: Soft, non-distended, non-tender. No palpable masses. Well-healed laparoscopic incisions.  Pelvic: Speculum exam with normal vaginal tissue, cuff visualized without lesions or defects. BME with in tact vaginal cuff, no masses palpated. Rectovaginal exam smooth.  Ext: symmetric, non-edematous     Pathology    01/05/21 Hysterectomy Pathology  FINAL PATHOLOGIC DIAGNOSIS     Uterus with adnexa, hysterectomy and bilateral salpingo-oophorectomy:  - Endometrium: Endometrioid carcinoma, grade 1, invading inner  myometrium.  - Myometrium:   1. 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  2. Leiomyomas.  - Cervix: Nabothian cysts.  - Serosa: Adhesions.  - Left fallopian tube: Paratubal cysts.  - Left ovary: Surface adhesions.  - Right fallopian tube: No significant pathologic abnormality.   - Right ovary: Cystic corpus luteum.        COMMENT:   Endometrial Cancer Synoptic Report     Procedures:       -Hysterectomy: Total hysterectomy.  -Ovaries: Bilateral oophorectomies.   -Fallopian tubes: Bilateral salpingectomies.  -Omentectomy: Not performed.  -Peritoneal sampling: Not performed.  -Pelvic washings: Performed.  -Vaginal cuff resection:  Not performed.  Histologic type: Endometrioid carcinoma.  Histologic grade: FIGO grade 1.   Myometrial invasion by tumor: Present.       - Depth of invasion: 0.6 cm (measured on slide A5).  - Thickness of myometrium:  1.4 cm.  - Percent of myometrium invaded:  43%.  Cervical stromal invasion by tumor: None.  Uterine serosal involvement by tumor: None.  Other organ involvement by tumor: None.  Lymphatic/vascular invasion: None.  Tumor size: <1 cm x <1 cm surface area; 0.6 cm thickness.  Pelvic lymph nodes:   -Total number (including sentinel nodes) examined:  0.        Para-aortic lymph nodes:   -  Total number (including sentinel nodes) examined: 0.         Ectocervical / vaginal cuff margin: Negative.  Paracervical / parametrial margin: Negative.  Peritoneal / ascitic fluid cytology: Benign.  Distant metastasis: None.  AJCC (8th edition) pathologic stage:  -pT: 1a.  -pN: X.  -pM: X.   FIGO stage: IA.       Assessment and Plan      66 y.o. 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     # Stage 1A FIGO grade 1, MMR-D endometrioid adenocarcinoma   I discussed my findings and impressions with the patient. FIGO grade 1, stage 1A disease with <50% myometrial invasion, no LVSI.  She has no high-risk features as defined by GOG and one high-risk feature (age) as defined by [place]. As such, no indication for adjuvant radiation and would recommend surveillance.     '[ ]'  Follow up with Cancer Genetic screening (referral sent last visit) - patient to ask daughter to schedule appt  '[ ]'  continue  surveillance visits q64mo    RTC [place] surveillance visit     Discussed with Gyn Onc attending physician Dr. AlPhilbert Riserwho formulated the plan and led the discussion   AlElberta LeatherwoodMD  PGY-4  [facility] City[facility] Ca[facility] [place]07/19/23

## 2021-10-10 NOTE — Progress Notes (Deleted)
Jacqueline Mclaughlin  Follow-Up Visit     ID: 66 y.o. female [phone] with Stage 1A FIGO grade 1, MMR-D endometrioid adenocarcinoma s/p RA-TLH, BSO 01/05/21, presenting for in-person follow-up.     Subjective    Jacqueline Mclaughlin is a 66 y.o. female who presents:   Chief Complaint              Follow-up     Endometrial adenocarcinoma           Interpreter: Available -  Name and/or ID: Jacqueline Mclaughlin  Interpreter Source: Telephone Interpreter  Language: Spanish    History of Present Illness   66 y.o. female [phone] with Stage 1A FIGO grade 1, MMR-D endometrioid adenocarcinoma s/p RA-TLH, BSO 01/05/21, presenting for in-person follow-up.     8/21:   - First episode of PMB. TVUS and pap smear normal.   -TVUS: Normal endometrial thickness of 3 mm. Several intramural fibroids, largest 3.0 cm in the fundus. 1.4 cm simple right ovarian cyst.     12/21:   - TVUS and EMB normal     07/11/20:  - Presents to ED for intermittent abdominal pain and spotting since August 2021  -TVUS: subcentimeter lesion in cervical canal, endometrial thickness 6 mm, 2 small fibroids  -CEA is 0.5. CA125 is 16. Plactin normal.     07/19/20:   - Referred to [facility] for continued PMB and pelvic pain. Continued bleeding since August (lasts up to 5 days, light to medium flow, cramping). Found to have a cervical lesion on exam. EMB performed.     07/21/20:   - Endometrial biopsy: Extremely scant benign fragmented surface endometrial glandular epithelium. Benign endocervical glandular tissue with microglandular hyperplasia. No hyperplasia, dysplasia or malignancy.  - Endocervix, biopsy: Benign endocervical glandular tissue. Benign ectocervical squamous epithelium. No dysplasia     09/12/20:   - Continued intermittent bleeding. PCP recommended repeat US. Patient sought GYN input. Scheduled for Pelvic exam under anesthesia, cervical dilation, hysteroscopy, endocervical and endometrial curettage, possible hysteroscopic polypectomy and/or  myomectomy     10/27/20:  - Pelvic EUA,  hysteroscopy D&C, polypectomy   - Uterus, endometrium, curettage: Endometrioid adenocarcinoma, FIGO grade 1-2. Sections of the endometrium curettage and polypectomy demonstrate an adenocarcinoma with tubular, cribriform and focal solid (5-10%) growth pattern. The tumor cells show moderate pleomorphism with enlarged hyperchromatic nuclei and small to medium amount of eosinophilic cytoplasm. Fragments of benign endometrial polyps are also noted. Immunostains are performed on tissue block A2 as follows: P53: Wild-type pattern, ER: Positive, diffuse and strong, Napsin A: Mostly negative.      MLH1 (M1): Positive, intact nuclear staining  PMS2 (A16-4): Positive, intact nuclear staining  MSH2 ([id number]): Negative, loss of nuclear staining  MSH6 (TF57): Positive, intact nuclear staining     Loss of nuclear staining in MSH2 only. This pattern is unusual, and may represent heterogeneity of tumor in the limited biopsy material. Repeat mismatch repair proteins testing in final excision is recommended to further rule out possible Lynch syndrome.  - Uterus, endocervix, curettage: mostly mucoid material. Scant benign endocervical glands.     11/06/20: [place] CT of chest abdomen pelvis w/ contrast, s/p hysteroscopy:  Unremarkable chest CT.Myomatous uterus with interval reduction in the size of the endometrial cavity. No abnormal lymphadenopathy or peritoneal spread identified.Colonic diverticulosis    01/05/21  RA-TLH, BSO, pelvic washings, uncomplicated. Pathology with Stage 1A Grade 1 endometrioid carcinoma, 43% myometrial invasion, no LVSI. Washings benign. Negative  for MLH1 promoter methylation.      01/24/21 Video visit with Dr. [name], doing well post-operatively.     Last seen on 04/11/21, patient was doing well. Referred to [facility] for colonoscopy.    On 05/10/2021, colonoscopy notable for one transverse and one descending colon  polyps.     Today, 10/10/21, ***    Doing well since last visit  No VB, abd pain, bloating, n/v, appetite changes, early satiety, bladder or bowel changes, fatigue, fevers/chills/night sweats  Endorses some weight loss since surgery (180lbs preop > 165lbs today) iso increased exercise.  Since last visit, she had a colonoscopy that revealed two 3-mm polyps. Pathology report indicates both are benign.  ***updated imaging  Clarified Gs and Ps with patient - she is [id number] and has had 2 spontaeous abortions.        Objective      Vitals      Flowsheet Row Most Recent Value   BP 143/68  [no headaches or dizzy pt did not take bp meds today]   Pulse 68   *Resp 16   Temp 35.8 C (96.4 F)   Temp src Temporal   SpO2 99 %   Weight 76.9 kg (169 lb 8 oz)   Height 162.6 cm (5' 4.02")  [04/2021 @ cc VF W/PT]   Pain Score 0   BMI (Calculated) 29.1        Measurements  BP: (!) 143/68 (no headaches or dizzy pt did not take bp meds today)  Pulse: 68  Temp: (!) 35.8 C (96.4 F)  Height: 162.6 cm (5' 4.02") (04/2021 @ cc VF W/PT)  Weight: 76.9 kg (169 lb 8 oz)  BMI (Calculated): 29.1        Physical exam***  Gen: well appearing, NAD  HEENT: no supraclavicular LAD  CV: regular rate and rhythm   Pulm: anterior lung fields CTAB  Abd: well healing laparoscopic incisions. Soft, non-distended, non-tender. No palpable masses  Pelvic: Speculum exam with normal vaginal tissue, cuff visualized without lesions or defects. BME with in tact vaginal cuff, no masses palpated. Rectovaginal exam smooth.   Ext: symmetric, non-edematous     Pathology    01/05/21 Hysterectomy Pathology  FINAL PATHOLOGIC DIAGNOSIS     Uterus with adnexa, hysterectomy and bilateral salpingo-oophorectomy:  - Endometrium: Endometrioid carcinoma, grade 1, invading inner  myometrium.  - Myometrium:   1. 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  2. Leiomyomas.  - Cervix: Nabothian cysts.  - Serosa: Adhesions.  - Left fallopian tube: Paratubal cysts.  - Left ovary: Surface  adhesions.  - Right fallopian tube: No significant pathologic abnormality.   - Right ovary: Cystic corpus luteum.       COMMENT:   Endometrial Cancer Synoptic Report     Procedures:       -Hysterectomy: Total hysterectomy.  -Ovaries: Bilateral oophorectomies.   -Fallopian tubes: Bilateral salpingectomies.  -Omentectomy: Not performed.  -Peritoneal sampling: Not performed.  -Pelvic washings: Performed.  -Vaginal cuff resection:  Not performed.  Histologic type: Endometrioid carcinoma.  Histologic grade: FIGO grade 1.   Myometrial invasion by tumor: Present.       - Depth of invasion: 0.6 cm (measured on slide A5).  - Thickness of myometrium:  1.4 cm.  - Percent of myometrium invaded:  43%.  Cervical stromal invasion by tumor: None.  Uterine serosal involvement by tumor: None.  Other organ involvement by tumor: None.  Lymphatic/vascular invasion: None.  Tumor size: <1 cm x <1 cm  surface area; 0.6 cm thickness.  Pelvic lymph nodes:   -Total number (including sentinel nodes) examined:  0.        Para-aortic lymph nodes:   -Total number (including sentinel nodes) examined: 0.         Ectocervical / vaginal cuff margin: Negative.  Paracervical / parametrial margin: Negative.  Peritoneal / ascitic fluid cytology: Benign.  Distant metastasis: None.  AJCC (8th edition) pathologic stage:  -pT: 1a.  -pN: X.  -pM: X.   FIGO stage: IA.       Assessment and Plan      66 y.o. female [id number] Stage 1A FIGO grade 1, MMR-D endometrioid adenocarcinoma s/p RA-TLH, BSO 01/05/21, undergoing surveillance and presenting for in-person follow-up.     # Stage 1A FIGO grade 1, MMR-D endometrioid adenocarcinoma     I discussed my findings and impressions with the patient. She is 66 y.o. and has FIGO grade 1, stage 1A disease with <50% myometrial invasion, no LVSI.  She has no high-risk features as defined by GOG and one high-risk feature (age) as defined by [place]. As such, no indication for adjuvant radiation and would recommend surveillance.      ***    '[ ]'  continue surveillance visits q89mo    RTC in [place] surveillance visit     Discussed with Gyn Onc attending physician Dr. AlPhilbert Riserwho formulated the plan and led the discussion   AlElberta LeatherwoodMD  PGY-4  [facility] [place]07/19/23

## 2022-04-10 ENCOUNTER — Ambulatory Visit: Admit: 2022-04-10 | Payer: MEDICARE | Attending: Gynecologic Oncology | Primary: Physician

## 2022-04-10 DIAGNOSIS — C541 Malignant neoplasm of endometrium: Secondary | ICD-10-CM

## 2022-04-10 MED ORDER — VITAMIN B COMPLEX CAPSULE
Freq: Every day | ORAL | 0.00 refills | 37.50000 days | Status: AC
Start: 2022-04-10 — End: ?

## 2022-04-10 NOTE — Progress Notes (Signed)
[facility]  Follow-Up Visit     ID: 67 y.o. female [phone] with Stage 1A FIGO grade 1, MMR-D endometrioid adenocarcinoma s/p RA-TLH, BSO 01/05/21, presenting for in-person follow-up.     Subjective    Jacqueline Mclaughlin is a 67 y.o. female who presents:     Interpreter: Available -  Name and/or ID: Marianna Fuss, [id number]  Interpreter Source: Telephone Interpreter  Language: Spanish    History of Present Illness   67 y.o. female [phone] with Stage 1A FIGO grade 1, MMR-D endometrioid adenocarcinoma s/p RA-TLH, BSO 01/05/21, presenting for in-person follow-up.     8/21:   - First episode of PMB. TVUS and pap smear normal.   -TVUS: Normal endometrial thickness of 3 mm. Several intramural fibroids, largest 3.0 cm in the fundus. 1.4 cm simple right ovarian cyst.     12/21:   - TVUS and EMB normal     07/11/20:  - Presents to ED for intermittent abdominal pain and spotting since August 2021  -TVUS: subcentimeter lesion in cervical canal, endometrial thickness 6 mm, 2 small fibroids  -CEA is 0.5. CA125 is 16. Plactin normal.     07/19/20:   - Referred to [facility] for continued PMB and pelvic pain. Continued bleeding since August (lasts up to 5 days, light to medium flow, cramping). Found to have a cervical lesion on exam. EMB performed.     07/21/20:   - Endometrial biopsy: Extremely scant benign fragmented surface endometrial glandular epithelium. Benign endocervical glandular tissue with microglandular hyperplasia. No hyperplasia, dysplasia or malignancy.  - Endocervix, biopsy: Benign endocervical glandular tissue. Benign ectocervical squamous epithelium. No dysplasia     09/12/20:   - Continued intermittent bleeding. PCP recommended repeat US. Patient sought GYN input. Scheduled for Pelvic exam under anesthesia, cervical dilation, hysteroscopy, endocervical and endometrial curettage, possible hysteroscopic polypectomy and/or myomectomy     10/27/20:  - Pelvic EUA,  hysteroscopy D&C, polypectomy   - Uterus,  endometrium, curettage: Endometrioid adenocarcinoma, FIGO grade 1-2. Sections of the endometrium curettage and polypectomy demonstrate an adenocarcinoma with tubular, cribriform and focal solid (5-10%) growth pattern. The tumor cells show moderate pleomorphism with enlarged hyperchromatic nuclei and small to medium amount of eosinophilic cytoplasm. Fragments of benign endometrial polyps are also noted. Immunostains are performed on tissue block A2 as follows: P53: Wild-type pattern, ER: Positive, diffuse and strong, Napsin A: Mostly negative.      MLH1 (M1): Positive, intact nuclear staining  PMS2 (A16-4): Positive, intact nuclear staining  MSH2 ([id number]): Negative, loss of nuclear staining  MSH6 (CH85): Positive, intact nuclear staining     Loss of nuclear staining in MSH2 only. This pattern is unusual, and may represent heterogeneity of tumor in the limited biopsy material. Repeat mismatch repair proteins testing in final excision is recommended to further rule out possible Lynch syndrome.  - Uterus, endocervix, curettage: mostly mucoid material. Scant benign endocervical glands.     11/06/20: [place] CT of chest abdomen pelvis w/ contrast, s/p hysteroscopy:  Unremarkable chest CT.Myomatous uterus with interval reduction in the size of the endometrial cavity. No abnormal lymphadenopathy or peritoneal spread identified.Colonic diverticulosis    01/05/21  RA-TLH, BSO, pelvic washings, uncomplicated. Pathology with Stage 1A Grade 1 endometrioid carcinoma, 43% myometrial invasion, no LVSI. Washings benign. Negative for MLH1 promoter methylation.      01/24/21 Video visit with Dr. [name], doing well post-operatively.     04/11/21, patient was doing well. Referred to [facility] for colonoscopy.    On 05/10/2021, colonoscopy notable for one transverse and one descending colon polyps.     10/10/21, Doing well since last visit.  No VB, abd pain, bloating, n/v, appetite  changes, early satiety, bladder or bowel changes, fatigue, fevers/chills/night sweats. Endorses some weight loss since surgery (180lbs preop > 165lbs today) iso increased exercise.  Since last visit, she had a colonoscopy that revealed two 3-mm polyps. Pathology report indicates both are benign.    Today 04/10/2022, patient is feeling overall okay but worried because she was supposed to have scans and did not have. She has been having pain her lower to mid abdomen for about 3 weeks to 1 month. Comes and goes, not able to discern an inciting factor. Patient also endorsing that she is having abnormal vaginal discharge that she says is yellow in color and watery and has been there for about 1 month. Denies vaginal bleeding. Denies Nausea/ vomiting. Eating well, denies early satiety. Denies fevers, chills. Denies constipation/diarrhea. Denies dysuria, frequency, urgency.       Review of Systems   Constitutional:  Negative for chills, fever and weight loss.   Respiratory:  Negative for cough and shortness of breath.    Cardiovascular:  Negative for chest pain and palpitations.   Gastrointestinal:  Positive for abdominal pain. Negative for constipation, diarrhea, nausea and vomiting.   Genitourinary:  Negative for dysuria, frequency and urgency.   Neurological:  Negative for dizziness and headaches.   Psychiatric/Behavioral: Negative.       Health care maintenance  - Pap: 02/02/2020, NILM, HPV negative  - Mammogram: 10/03/2020, normal  - Colonoscopy: 05/09/2021, two 3-mm polyps removed, both benign  - DEXA: never    Objective             Physical exam  Gen: well appearing, NAD  HEENT: no supraclavicular LAD  CV: regular rate and rhythm   Pulm: anterior lung fields CTAB  Abd: Soft, non-distended, non-tender. No palpable masses. Well-healed laparoscopic incisions and midline vertical incision.   Pelvic: Speculum exam with normal vaginal tissue, cuff visualized without lesions or defects. BME with in tact vaginal cuff, no  masses palpated. Rectovaginal exam smooth.  Ext: symmetric, non-edematous     Pathology    01/05/21 Hysterectomy Pathology  FINAL PATHOLOGIC DIAGNOSIS     Uterus with adnexa, hysterectomy and bilateral salpingo-oophorectomy:  - Endometrium: Endometrioid carcinoma, grade 1, invading inner  myometrium.  - Myometrium:   1. 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  2. Leiomyomas.  - Cervix: Nabothian cysts.  - Serosa: Adhesions.  - Left fallopian tube: Paratubal cysts.  - Left ovary: Surface adhesions.  - Right fallopian tube: No significant pathologic abnormality.   - Right ovary: Cystic corpus luteum.       COMMENT:   Endometrial Cancer Synoptic Report     Procedures:       -Hysterectomy: Total hysterectomy.  -Ovaries: Bilateral oophorectomies.   -Fallopian tubes: Bilateral salpingectomies.  -Omentectomy: Not performed.  -Peritoneal sampling: Not performed.  -Pelvic washings: Performed.  -Vaginal cuff resection:  Not performed.  Histologic type: Endometrioid carcinoma.  Histologic grade: FIGO grade 1.   Myometrial invasion by tumor: Present.       - Depth of invasion: 0.6 cm (measured on slide A5).  - Thickness of myometrium:  1.4 cm.  - Percent of myometrium invaded:  43%.  Cervical stromal invasion by tumor: None.  Uterine serosal involvement by tumor: None.  Other organ involvement by tumor: None.  Lymphatic/vascular invasion: None.  Tumor size: <1 cm x <1 cm surface area; 0.6 cm thickness.  Pelvic lymph nodes:   -Total number (including sentinel nodes) examined:  0.        Para-aortic lymph nodes:   -Total number (including sentinel nodes) examined: 0.         Ectocervical / vaginal cuff margin: Negative.  Paracervical / parametrial margin: Negative.  Peritoneal / ascitic fluid cytology: Benign.  Distant metastasis: None.  AJCC (8th edition) pathologic stage:  -pT: 1a.  -pN: X.  -pM: X.   FIGO stage: IA.       Assessment and Plan      67 y.o. female [id number] Stage 1A FIGO grade 1, MMR-D endometrioid  adenocarcinoma s/p RA-TLH, BSO 01/05/21, undergoing surveillance and presenting for in-person follow-up.     # Stage 1A FIGO grade 1, MMR-D endometrioid adenocarcinoma   FIGO grade 1, stage 1A disease with <50% myometrial invasion, no LVSI, s/p RA TLH BSO 01/05/2021.  She has no high-risk features as defined by GOG and one high-risk feature (age) as defined by [place]. As such, no indication for adjuvant radiation and would recommend surveillance.   - discussed ongoing concern for hereditary cancer syndrome given tumor [facility] deficient, discussed importance for patient to be evaluated in hereditary cancer and genetic clinic, especially given possible implications for her children.   '[ ]'$  Follow up with Cancer Genetic screening - referral placed today  '[ ]'$  return to clinic in 3 months to discuss [facility] evaluation/recommendations    #Vaginal Discharge  - Likely leaking urine, Speculum exam today without visible vaginal discharge or bleeding. Cuff intact without visible lesions.   - Patient with irritation of perineum and saddle area consistent with moisture on exam    #Abdominal Pain  - Discussed option with patient to repeat imaging now or continue to monitor the pain  - Patient opting to monitor and if persistent abdominal pain in 1-2 months will recommend repeat imaging.   '[ ]'$  CTM    RTC in 3 mo for surveillance visit     Discussed with Gyn Onc attending physician Dr. [name], who formulated the plan and led the discussion     Clayborn Heron MD, MPH  Obstetrics, Gynecology & RS, PGY1  [employer] Phone: [phone]  04/10/22

## 2022-06-12 ENCOUNTER — Other Ambulatory Visit: Admit: 2022-06-13 | Payer: MEDICARE | Attending: MS" | Primary: Physician

## 2022-06-12 DIAGNOSIS — Z7183 Encounter for nonprocreative genetic counseling: Secondary | ICD-10-CM

## 2022-06-12 NOTE — Progress Notes (Signed)
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 Prior to initiating the consultation, I obtained informed verbal consent to perform this consultation using Telehealth tools and answered all the questions about the Telehealth interaction.      I had the pleasure of seeing Jacqueline Mclaughlin today for genetic counseling. The patient was unaccompanied to this appointment. A Lexicographer ([place], Minna Merritts) was also utilized for today's visit.  _________________________________________________________________    Summary and Plan:   The patient was referred for genetic counseling due to her personal diagnosis of uterine cancer, as well as her family history of cancer.  At the conclusion of this visit, consent was obtained and the patient elected to proceed with germline genetic testing for hereditary cancer predisposition.   The patient agreed to submit a buccal (cheek) swab sample to the lab via a collection kit that will be sent to their home. Lab orders have been placed.  Results will be available approximately 3-4 weeks after the sample is received.    Rarely a sample will not produce adequate quantities of DNA for testing. If this happens, an additional sample may be requested.   Plan for results disclosure: I will call the patient with results when they are available.  If there is a significant finding, then we will schedule a video appointment for in-depth discussion.  [facility] ([facility]) billing policies and practices were explained.  Patients are reminded to be aware of MyChart correspondence with regard to billing and costs for genetic testing.  Cancer screening and prevention recommendations as well as considerations for family members will be provided when testing is complete.  The enrollment forms for our [facility] and Prevention Program Long-term Follow-up Registry will be sent electronically via DocuSign for signature if the patient would like to enroll.    _________________________________________________________________      Personal Cancer History:   Jacqueline Mclaughlin is a 67 y.o. female who was diagnosed with endometrial adenocarcinoma at age 60. She had her first episode of abnormal uterine bleeding around August 2021, but workup and evaluation was normal at the time. She presented to the ED on 07/11/2020 for continued abdominal pain since then, and was referred for follow up and management to [facility]. She had an endometrial biopsy performed on 07/21/2020 which showed "extremely scant benign fragmented surface endometrial glandular epithelium" with other benign findings and no dysplasia.     She was recommended for additional gynecological workup, and underwent pelvic exam under anesthesia, hysteroscopy, polypectomy, and uterine curettage on 10/27/2020. Pathology was positive for endometrioid adenocarcinoma: "Endometrioid adenocarcinoma, FIGO grade 1-2. Sections of the endometrium curettage and polypectomy demonstrate an adenocarcinoma with tubular, cribriform and focal solid (5-10%) growth pattern. The tumor cells show moderate pleomorphism with enlarged hyperchromatic nuclei and small to medium amount of eosinophilic cytoplasm. Fragments of benign endometrial polyps are also noted. Immunostains are performed on tissue block A2 as follows: P53: Wild-type pattern, ER: Positive, diffuse and strong, Napsin A: Mostly negative.      MLH1 (M1):  Positive, intact nuclear staining  PMS2 (A16-4): Positive, intact nuclear  staining  MSH2 [id number]): Negative, loss of nuclear staining  MSH6 [id number]): Positive, intact nuclear staining     Loss of nuclear staining in MSH2 only. This pattern is unusual, and may represent heterogeneity of tumor in the limited biopsy material. Repeat mismatch repair proteins testing in final excision is recommended to further rule out possible Lynch syndrome."    She had unremarkable staging CTs, and underwent robot-assisted total laparoscopic hysterectomy (TLH) and bilateral salpingoopherectomy (BSO) on 01/05/2021. Pathology was positive for stage 1A Grade 1 endometrioid carcinoma, 43% myometrial invasion, no LVSI. Washings benign. Negative for MLH1 promoter methylation.    She established care with Dr. [name] on 01/24/2021, and has since been seen for surveillance since then. She has not been recommended for additional treatment or radiation, and will be monitored for any emerging pain/symptoms.     Oncology Care Provider(s): Dr. Ovidio Hanger ([facility])       Additional Relevant Medical History:   Cancer Screening History:  Colon: She underwent screening colonoscopy on 05/10/2021, and she was found to have two polyps (one transverse and one descending). Pathology showed "Colonic mucosa with prominent lymphoid aggregate" for both polyps. Ms. Gin is not sure when she was told to repeat colonoscopy screening.   Breast: Her most recent mammogram on 10/03/2020 was reportedly negative. She is currently in the process of scheduling her next mammogram, but has not done so yet.    No additional relevant surgical history or other organs removed  No history of smoking or tobacco use      Family History:    Family history was reviewed today with a detailed three-generation pedigree. Reported family history of cancer is summarized below. Medical records for family members were not available for review unless otherwise noted.     Family History       Relation Problem Comments    Mother (Deceased) Stomach cancer  (Age: 64) Had stomach cancer "burst/bleed" multiple times, d.55       Maternal Aunt (Deceased) Pancreatic cancer (Age: 67) d.86, poor in [place], limited MD access for everyone                  Maternal ancestry: [place]  Paternal ancestry: [place]   No known Ashkenazi Jewish ancestry  No known consanguinity      Genetics and Cancer:  The majority of cancers happen by chance, often as part of the natural aging process.  Some cancers can be related to environmental exposures or behaviors (such as smoking).  A small portion of cancers are hereditary, meaning they are caused by a genetic risk factor passed down in a family from parent to child. Each person inherits thousands of genes from their mother and father. Some of these genes work to protect the body from developing cancer. If one of these protective genes is not working - described as a pathogenic variant or a mutation - this can increase the risk of developing certain types of cancer.       Some signs that there might be a genetic risk factor for cancer running in the family include:  cancer at young ages  rare types of cancer  more than one type of cancer in the same person  multiple people with cancer in a family, especially if they are the same kind of cancer        Genetic Risk Assessment:   The major feature(s) in the patient's family  that is suggestive of a hereditary cancer predisposition is (are): her own diagnosis of mismatch repair deficient uterine cancer, in addition to her reported family history of stomach and pancreatic cancer.     Endometrial cancer:  The patient's personal and family history could be caused by a mutation in the MLH1, MSH2, MSH6, PMS2 or EPCAM genes or a mutation in one of several other cancer susceptibility genes such as PTEN.   Lynch syndrome screening was performed on the patient's endometrial tumor. The results are as follows: Molecular tissue analysis is recommended as a screening test for Lynch syndrome.  It includes  microsatellite instability (MSI) testing and immunohistochemical (IHC) staining for the mismatch repair proteins MLH1, MSH2, MSH6 and PMS2. These tests were completed on the patient's endometrial tumor.   The tumor was microsatellite stable (MSS), as 15 of the 86 tested microsatellites (17.44%) were unstable.   The tumor also showed absent expression of MSH2. Expression of MLH1, MSH6 and PMS2 was present. This pattern of tumor results is suspicious for a diagnosis of Lynch Syndrome. Germline genetic testing is recommended for this patient.  A prospective study published in 2021 assessed the utility of up-front germline multi-gene panel testing (MGPT) in a large and unselected series of endometrial cancer patients, including those diagnosed over 67 years old.  Of the participants, 10.1% were found to have pathogenic or likely pathogenic variants in a cancer susceptibility gene, 3% specifically in an MMR gene and consequently a Lynch syndrome diagnosis. The study identified a 2.5% increase in yield of those with high- or moderate-penetrance variants for Lynch syndrome and/or hereditary breast and ovarian cancer genes in women who would not have been recommended for MGPT on the basis of NCCN and MMR IHC criteria. (Reference: DOI: 10.1200/PO.21.00249 JCO Precision Oncology no. 5 (2021) [id number].)    Genetic Testing / Medical Necessity:   Genetic testing is important for determining cancer screening, prevention, and treatment. A positive genetic test result is associated with specific guidelines for personalized cancer screening, prevention, and treatment.      The patient meets the [place] (NCCN) Lynch syndrome criteria for genetic testing based on her own diagnosis of endometrial cancer at any age, a first-degree relative diagnosed with stomach cancer, and a second-degree relative diagnosed with pancreatic cancer.      Informed Consent:    The risks, benefits, and limitations of genetic  testing including the possibility of incidental findings and inconclusive results were reviewed. The patient expressed understanding.  We also explored the potential emotional and psychosocial implications of being at risk for hereditary cancer and going through the genetic testing process.     Test Selection:    We discussed different genetic testing options (smaller disease-focused panels vs. large pan-cancer panels). It is reasonable to consider testing with a multi-gene panel based on the fact that there are numerous genes associated with hereditary cancer susceptibility. Although specific genes may be more strongly associated with certain types of cancers than others, the field of clinical cancer genetics is rapidly growing and we are constantly learning about new disease associations. Genetic testing with a multi-gene panel is supported by the NCCN 2024 Guidelines. The patient ultimately chose to move forward with genetic testing.      Possible Types of Genetic Test Results for Each Gene Tested:     Positive - If a pathogenic variant (mutation) is found, the patient may have an increased risk to develop certain types of cancer in the future. Personalized screening recommendations based on  the specific pathogenic variant and personal/family history will be provided. A positive result may also influence cancer treatment and management decisions. The identification of a pathogenic variant can have implications for family members and genetic testing for relatives could help determine their cancer risks, and allow for modifications to their screening plan with the goal of cancer prevention and/or early detection.       Negative - If no pathogenic variant (mutation) is found, this would significantly decrease the likelihood of the patient having an inherited predisposition to cancer associated with the gene(s) tested. The possibility of a missed pathogenic variant, or a causative pathogenic variant in another gene  not included in the test cannot be eliminated. Other informative family members may be encouraged to seek genetic counseling and consider testing.      Variant of Unknown Significance (VUS) - This is when a variation in a gene is noted and there is not enough information to determine whether this change increase the chances of developing cancer. The majority of VUS's will be reclassified as benign, meaning that they are likely to represent normal human variation and do not increase cancer risk. VUS results are common and should not be used to make health decisions.          Thank you for allowing me to participate in [facility] care.  Please feel free to contact me with any questions or new information.      Sincerely,      Gilford Raid, [place], [facility] (she/her)  [facility] and Prevention Program  Phone: [phone]  Fax: [phone]      C: Wilma Flavin, MD  [address], [place] [address]  Jacqueline Mclaughlin (via [place])      Time Spent Counseling Patient: Start Time: 1:48pm End Time: 2:51pm   _________________________________________________________________    [employer] FOLLOWED: Genetic counseling was provided according to the guidelines and standards outlined by the American Society of Clinical Oncologists (ASCO), [employer] (NSGC), Artist (NCCN) and the Commission on Cancer [facility]), where applicable.     GENETIC DISCRIMINATION: The federal Genetic Information Nondiscrimination Act (GINA) was signed into law in 2008. This law restricts the use of genetic information in health insurance coverage and employment decisions. Certain restrictions apply depending on insurance and employer. Please [email or web address][employer]uy more information.?     FUTURE CORRESPONDENCE: Recommendations, family history analysis, and risk assessment are based on information available at this time.? Our knowledge of genetics  and hereditary cancer predisposition syndromes is constantly changing and evolving.? New genes and associations continue to be discovered and characterized.? If there are any changes to the personal or family history, such as new diagnoses of tumors/cancer or new genetic test results, patients should inform their healthcare providers and re-contact Cancer Genetics as this may change the assessment and recommendations outlined above.?

## 2022-06-25 ENCOUNTER — Inpatient Hospital Stay: Payer: MEDICARE | Primary: Physician

## 2022-06-25 DIAGNOSIS — Z7183 Encounter for nonprocreative genetic counseling: Secondary | ICD-10-CM

## 2022-06-25 DIAGNOSIS — C541 Malignant neoplasm of endometrium: Secondary | ICD-10-CM

## 2022-07-08 LAB — EXPANDED HEREDITARY CANCER PANEL: Pathologist: 62169

## 2022-11-13 ENCOUNTER — Other Ambulatory Visit: Admit: 2022-11-13 | Discharge: 2022-11-13 | Payer: MEDICARE | Attending: MS" | Primary: Physician

## 2022-11-13 DIAGNOSIS — Z1379 Encounter for other screening for genetic and chromosomal anomalies: Secondary | ICD-10-CM

## 2022-11-13 DIAGNOSIS — C541 Malignant neoplasm of endometrium: Secondary | ICD-10-CM

## 2022-11-13 NOTE — Progress Notes (Signed)
[facility]  Genetic Counseling Note - Post Test Encounter     Date of Service: 11/13/2022   Reason for Visit: Genetic test results discussion  Referring Provider:  Gweneth Fritter, MD  [address]   [place],  [place] [address]     I performed this consultation using real-time Telehealth tools, including a live video connection between my location and the patient's location. Prior to initiating the consultation, I obtained informed verbal consent to perform this consultation using Telehealth tools and answered all the questions about the Telehealth interaction.      I had the pleasure of seeing Jacqueline Mclaughlin today for genetic counseling. The patient was unaccompanied to this appointment. A Spanish medical interpreter ([place] Bar[place] Lyme) was also utilized for today's appointment.    __________________________________________________________________________     Summary and Plan:   I initially met the patient for genetic counseling due to her personal and family history of cancer.  [facility] 87 gene Expanded Hereditary Cancer Panel was negative for pathogenic variants ("mutations").  One or more variants of uncertain significance (VUS) were identified. Clinical management and medical decisions should be based on personal and family history. The presence of a VUS does not impact these recommendations  Genetic testing did not identify an underlying hereditary cause for the cancer history. The patient's personal and family history of cancer remains unexplained at this time.  No additional germline genetic testing for hereditary cancer predisposition is recommended for this patient at this time.  The patient will continue with management and surveillance for the history of uterine cancer based on the recommendations of established healthcare providers as planned.      The patient's immediate relatives can consider increased screening  based upon the family history of cancer.   The patient was encouraged to re-contact Cancer Genetics every 1-2 years for updates about genetic testing options.  The patient was informed about our [facility] Long-term Follow-up Registry. The enrollment forms were sent electronically via DocuSign, but the patient has not yet enrolled.  ___________________________________________________________________________     [facility] GENETIC TEST ORDERED:  [facility] for the Expanded Hereditary Cancer Panel, which includes testing of 87 genes: AIP, ALK, APC, ATM, AXIN2, BAP1, BARD1, BLM, BMPR1A, BRCA1, BRCA2, BRIP1, CASR, CDC73, CDH1, CDK4, CDKN1B, CDKN2A, CEBPA, CHEK2, CTNNA1, DICER1, EGFR, EGLN1, EPCAM, ERBB2, FH, FLCN, GALNT12, GATA2, GREM1, HOXB13, KIF1B, KIT, LZTR1, MAX, MEN1, MET, MITF, MLH1, MLH3, MSH2, MSH3, MSH6, MUTYH, NF1, NF2, NTHL1, PALB2, PDGFRA, PHOX2B, PMS2, PMSCL, POLD1, POLE, POT1, PRKAR1A, PRSS1, PTCH1, PTEN, RAD51C, RAD51D, RB1, RET, RHBDF2, RNF43, RPS20, RUNX1, SCG5, SDHA, SDHAF2, SDHB, SDHC, SDHD, SMAD4, SMARCB1, SMARCE1, SMARCA4, STK11, SUFU, TMEM127, TP53, TSC1, TSC2, VHL, WT1, YAP1.    * See the laboratory report under "Pathology" tab for technical details of gene coverage           RESULTS:  No pathogenic variants ("mutations") were identified in any of the genes analyzed. Variant(s) of uncertain significance (VUS) identified:   PRSS1 c.[id number] (p.[id number])        A copy of the test report is available in the "Pathology" and "Labs" tabs in [facility]. The report was released to the patient's MyChart.    Assessment and Interpretation:  There is no evidence of an underlying genetic predisposition to explain the cancer in this family at this time. This genetic test result significantly reduces the chance that the patient has an  inherited predisposition to cancer.  However, the patient still has a personal and/or family history of cancer that has not  been explained by current genetic testing. We discussed some possible explanations:     The cancer history may be sporadic or multifactorial. Multifactorial risk is due to a combination of genetic and non-genetic (environmental, hormonal, etc.) risk factors that independently have a Mclaughlin effect, but in combination may act together to increase risk. In the case of multifactorial cancer, there may be a moderately increased risk for cancer that is not detectable by current testing methods.    The cancer in this family could be due to a pathogenic variant that the patient did not inherit. Even in families with a known pathogenic variant, there can be individuals who develop cancers that are sporadic and not due to a hereditary predisposition.    The cancer history in the family could be due to a pathogenic variant that is not detectable by this test. This could be a rare variant in one of the genes tested, a variant in a known gene that was not included, or a variant in a gene that has yet to be discovered. Update testing may be appropriate in the future.     One or more variants of uncertain significance (VUS) were identified (listed above). This means that the patient has a form of these genes that differs from the typical form of these genes. The classification of a VUS can change over time and the majority of VUSs will eventually be reclassified as polymorphisms, meaning that they are most likely to represent normal human variation and have no significance to health. For this reason, clinical management and medical decisions should be based on personal and family history and not on the presence of a VUS. There is a very Mclaughlin possibility that a VUS could later be determined to be associated with cancer risk and the patient is encouraged to re-contact me for updates about their VUS classification periodically.    However, even if this were to happen, mutations in the PRSS1 gene(s) are not currently associated with  uterine cancer risk, and therefore are unlikely to explain the personal history of cancer.     Additional considerations:  Other family members may want to consider genetic testing as the patient's genetic test result does not rule out the possibility of a genetic mutation in them. Any positive family member results could change our interpretation of the the patient's results and could impact recommendations for cancer screening.    We would be happy to see any of the patient's family members in our genetics clinic if they live in [place] or are visiting. Otherwise, they can seek referral from their healthcare providers or find a genetic counselor in their area by going to the [employer]' website, [email or web address].    Additional testing:  Based on the extensive testing completed, no additional germline genetic testing for hereditary cancer predisposition is recommended at this time. The patient is encouraged to re-contact Cancer Genetics periodically for updates and if there are any new cancer diagnoses in the family.       Cancer Screening and Prevention:  Screening recommendations can change over time and may also change if the family history were to change, and/or if a genetic mutation is identified in the family in the future. Family members should discuss recommendations with their healthcare providers in the context of their own personal risk factors and the cancer histories in their close relatives (  if there is any).     Uterine Cancer:  The patient will continue with treatment and surveillance for the personal history of uterine cancer based upon the healthcare providers' recommendations.  The American Cancer Society recommends that at the time of menopause, all women should be told about the risks and symptoms of endometrial cancer. Women should report any unexpected vaginal bleeding or spotting to their doctors. Other symptoms of uterine cancer may include  difficulty urinating, pain when urinating, pain during sexual intercourse, pelvic pain or unexplained weight loss. If symptoms arise, consult a physician. Women taking medications like tamoxifen should discuss this with their providers.     Other cancer types:  The patient may follow general population screening guidelines for other common cancer types or follow the guidance of the patient's doctors for additional screening based on personal risk factors.      Referrals:  None.  The patient will continue to be followed by already established healthcare providers as planned.    Thank you for allowing me to participate Maire's care. Please do not hesitate to call me with any questions about this information.     Sincerely,      Glory Buff, MS, [facility] (she/her)  Dentist  Cancer Genetics and Prevention Program  Phone: [phone]  Fax: [phone]      Pedigree / Family History:  Previously obtained during our pre-test visit on 06/12/2022.  *Pedigree uploaded to the [facility] EMR ([facility]) under "Scan Clin".    Please see my clinic note from our previous encounter for further details about the patient's personal and family history of cancer and the pre-test genetic risk assessment.     Genetic Testing Results:          C: Gweneth Fritter, MD  [address]   [place],  [place] [address]  Jacqueline Mclaughlin (via [facility] MyChart)     Time spent in consultation with the patient:  Start time: 3:02pm  End time: 3:15pm   ___________________________________________________________________________________    GUIDELINES FOLLOWED: Genetic counseling was provided according to the guidelines and standards outlined by the American Society of Clinical Oncologists (ASCO), National Society of Genetic Counselors (NSGC), [employer] (NCCN) and the Commission on Cancer [facility]), where applicable.     GENETIC DISCRIMINATION: The federal Genetic Information Nondiscrimination Act (GINA) was signed  into law in 2008. This law restricts the use of genetic information in health insurance coverage and employment decisions. Certain restrictions apply depending on insurance and employer. Please [email or web address] more information.?     FUTURE CORRESPONDENCE: Recommendations, family history analysis, and risk assessment are based on information available at this time.? Our knowledge of genetics and hereditary cancer predisposition syndromes is constantly changing and evolving.? New genes and associations continue to be discovered and characterized.? If there are any changes to the personal or family history, such as new diagnoses of tumors/cancer or new genetic test results, patients should inform their healthcare providers and re-contact Cancer Genetics as this may change the assessment and recommendations outlined above.?

## 2022-11-27 ENCOUNTER — Ambulatory Visit: Admit: 2022-11-27 | Discharge: 2022-12-05 | Payer: MEDICARE | Attending: Gynecologic Oncology | Primary: Physician

## 2022-11-27 DIAGNOSIS — C541 Malignant neoplasm of endometrium: Secondary | ICD-10-CM

## 2022-11-27 NOTE — Progress Notes (Signed)
[facility]  Follow-Up Visit     ID: 67 y.o. female [id number] with Stage 1A FIGO grade 1, MMR-D endometrioid adenocarcinoma s/p RA-TLH, BSO 01/05/21, presenting for surveillance in-person follow-up.     Subjective    Jacqueline Mclaughlin is a 67 y.o. female who presents:   Chief Complaint              Endometrial adenocarcinoma     Follow-up           Interpreter: Available -  Name and/or ID: Arlys John  Interpreter Source: Telephone Interpreter  Language: Spanish    History of Present Illness   67 y.o. 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     8/21:   - First episode of PMB. TVUS and pap smear normal.   -TVUS: Normal endometrial thickness of 3 mm. Several intramural fibroids, largest 3.0 cm in the fundus. 1.4 cm simple right ovarian cyst.     12/21:   - TVUS and EMB normal     07/11/20:  - Presents to ED for intermittent abdominal pain and spotting since August 2021  -TVUS: subcentimeter lesion in cervical canal, endometrial thickness 6 mm, 2 small fibroids  -CEA is 0.5. CA125 is 16. Plactin normal.     07/19/20:   - Referred to [facility] for continued PMB and pelvic pain. Continued bleeding since August (lasts up to 5 days, light to medium flow, cramping). Found to have a cervical lesion on exam. EMB performed.     07/21/20:   - Endometrial biopsy: Extremely scant benign fragmented surface endometrial glandular epithelium. Benign endocervical glandular tissue with microglandular hyperplasia. No hyperplasia, dysplasia or malignancy.  - Endocervix, biopsy: Benign endocervical glandular tissue. Benign ectocervical squamous epithelium. No dysplasia     09/12/20:   - Continued intermittent bleeding. PCP recommended repeat US. Patient sought GYN input. Scheduled for Pelvic exam under anesthesia, cervical dilation, hysteroscopy, endocervical and endometrial curettage, possible hysteroscopic polypectomy and/or  myomectomy     10/27/20:  - Pelvic EUA,  hysteroscopy D&C, polypectomy   - Uterus, endometrium, curettage: Endometrioid adenocarcinoma, FIGO grade 1-2. Sections of the endometrium curettage and polypectomy demonstrate an adenocarcinoma with tubular, cribriform and focal solid (5-10%) growth pattern. The tumor cells show moderate pleomorphism with enlarged hyperchromatic nuclei and small to medium amount of eosinophilic cytoplasm. Fragments of benign endometrial polyps are also noted. Immunostains are performed on tissue block A2 as follows: P53: Wild-type pattern, ER: Positive, diffuse and strong, Napsin A: Mostly negative.      MLH1 (M1): Positive, intact nuclear staining  PMS2 (A16-4): Positive, intact nuclear staining  MSH2 (A540-9811): Negative, loss of nuclear staining  MSH6 (BJ47): Positive, intact nuclear staining     Loss of nuclear staining in MSH2 only. This pattern is unusual, and may represent heterogeneity of tumor in the limited biopsy material. Repeat mismatch repair proteins testing in final excision is recommended to further rule out possible Lynch syndrome.  - Uterus, endocervix, curettage: mostly mucoid material. Scant benign endocervical glands.     11/06/20: [facility]  - Staging CT of chest abdomen pelvis w/ contrast, s/p hysteroscopy:  Unremarkable chest CT.Myomatous uterus with interval reduction in the size of the endometrial cavity. No abnormal lymphadenopathy or peritoneal spread identified.Colonic diverticulosis    01/05/21  RA-TLH, BSO, pelvic washings, uncomplicated. Pathology with Stage 1A Grade 1 endometrioid carcinoma, 43% myometrial invasion, no LVSI. Washings benign. Negative  for MLH1 promoter methylation.      01/24/21 Video visit with Dr. Onalee Hua, doing well post-operatively.     04/11/21, patient was doing well. Referred to Cancer Genetics and GI for colonoscopy.    On 05/10/2021, colonoscopy notable for one transverse and one descending colon polyps.      10/10/21, Doing well since last visit.  No VB, abd pain, bloating, n/v, appetite changes, early satiety, bladder or bowel changes, fatigue, fevers/chills/night sweats. Endorses some weight loss since surgery (180lbs preop > 165lbs today) iso increased exercise.  Since last visit, she had a colonoscopy that revealed two 3-mm polyps. Pathology report indicates both are benign.    04/10/2022, doing well, intermittent abdominal pain, discussed recommendation for imaging and patient declined but would be amenable if pain continued. Was recommended to follow up in 3 months    07/10/22 - no show to clinic    Today, 11/27/22, she reports she has had a bad headache associated with nausea and vomiting for 3 weeks. Went to the ED in [facility] a few weeks ago and was treated for her nausea and got medicine to treat her blood pressure at that time. Has not experienced nausea since then but has experienced ongoing dizziness, feeling like the room is spinning. Headache is sometimes daily, but sometimes it goes away completely on its own. Sometimes the headache associated with blurry vision, and today she is having blurry vision. Usually wears glasses, but vision is still blurry even when she wears her glasses. Reports it has been over 2 years since her last visit to the eye doctor. Doesn't take any medicine for headaches, drinks tea which helps. Denies weakness or numbness in arms or legs. Has cramps in her legs sometimes when sleeping.    Reports she is feeling nervous about today's visit. She also reports she sometimes has abdominal pain, in her groin area, doesn't think it is her kidneys, comes and goes. She thinks this is the same pain she was having in January. Would describe the pain as nerve pain, like when your leg goes to sleep and then is trying to wake again. The pain does not radiate down her legs, but is very strong inside deep inside her abdomen.     Reports increased fatigue. Eating normal amounts of food, denies  early satiety and bloating. Reports normal bowel movements. Denies dysuria. Denies unintentional weight loss.    Taking medications daily for her BP, but is unsure what it is called, because it was recently changed.      Review of Systems   Constitutional:  Positive for malaise/fatigue. Negative for chills, fever and weight loss.   Eyes:  Positive for blurred vision.   Respiratory:  Negative for cough and shortness of breath.    Cardiovascular:  Negative for chest pain and palpitations.   Gastrointestinal:  Positive for abdominal pain. Negative for blood in stool, constipation, diarrhea, nausea and vomiting.   Genitourinary:  Negative for dysuria, frequency and urgency.   Musculoskeletal:  Negative for falls.   Neurological:  Positive for dizziness and headaches. Negative for tingling, sensory change, focal weakness and weakness.   Psychiatric/Behavioral: Negative.       Health care maintenance  - Pap: 02/02/2020, NILM, HPV negative  - Mammogram: 10/03/2020, normal  - Colonoscopy: 05/09/2021, two 3-mm polyps removed, both benign  - DEXA: never    Objective      Vitals      Flowsheet Row Most Recent Value   BP 121/42  [  PT STATES FEELING A BIT DIZZY, WHEN STANDING TOO FAST]   Pulse 79   *Resp 16   Temp 37.2 C (99 F)   Temp src Temporal   SpO2 99 %   Weight 79.5 kg (175 lb 3.2 oz)   Height --  [9/24 @ cc vf with pt]   Pain Score 0          Measurements  Height: 0 cm (0') (9/24 @ cc vf with pt)        Physical exam  Gen: well appearing, NAD  HEENT: no supraclavicular LAD  CV: regular rate and rhythm   Pulm: NWOB  Abd: Soft, non-distended, non-tender. No palpable masses. Well-healed laparoscopic incisions and midline vertical incision.   Pelvic: Speculum exam with atrophic vaginal mucosa, redundant vaginal tissue, no lesions visualized. BME with in tact vaginal cuff, no masses palpated. Rectovaginal exam smooth and without nodularity.  Ext: symmetric, non-edematous     Pathology    01/05/21 Hysterectomy Pathology  FINAL  PATHOLOGIC DIAGNOSIS     Uterus with adnexa, hysterectomy and bilateral salpingo-oophorectomy:  - Endometrium: Endometrioid carcinoma, grade 1, invading inner  myometrium.  - Myometrium:   1. 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  2. Leiomyomas.  - Cervix: Nabothian cysts.  - Serosa: Adhesions.  - Left fallopian tube: Paratubal cysts.  - Left ovary: Surface adhesions.  - Right fallopian tube: No significant pathologic abnormality.   - Right ovary: Cystic corpus luteum.       COMMENT:   Endometrial Cancer Synoptic Report     Procedures:       -Hysterectomy: Total hysterectomy.  -Ovaries: Bilateral oophorectomies.   -Fallopian tubes: Bilateral salpingectomies.  -Omentectomy: Not performed.  -Peritoneal sampling: Not performed.  -Pelvic washings: Performed.  -Vaginal cuff resection:  Not performed.  Histologic type: Endometrioid carcinoma.  Histologic grade: FIGO grade 1.   Myometrial invasion by tumor: Present.       - Depth of invasion: 0.6 cm (measured on slide A5).  - Thickness of myometrium:  1.4 cm.  - Percent of myometrium invaded:  43%.  Cervical stromal invasion by tumor: None.  Uterine serosal involvement by tumor: None.  Other organ involvement by tumor: None.  Lymphatic/vascular invasion: None.  Tumor size: <1 cm x <1 cm surface area; 0.6 cm thickness.  Pelvic lymph nodes:   -Total number (including sentinel nodes) examined:  0.        Para-aortic lymph nodes:   -Total number (including sentinel nodes) examined: 0.         Ectocervical / vaginal cuff margin: Negative.  Paracervical / parametrial margin: Negative.  Peritoneal / ascitic fluid cytology: Benign.  Distant metastasis: None.  AJCC (8th edition) pathologic stage:  -pT: 1a.  -pN: X.  -pM: X.   FIGO stage: IA.       Assessment and Plan      67 y.o. 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    #headaches, dizziness  - unclear etiology at this time, ddx includes medication side effect from recently changed blood pressure medications, benign vertigo, metastatic disease to brain  - no focal neurologic deficits today, which is overall reassuring  - patient advised to follow up with PCP given low BP in office today (SBP 30-40s)  [ ]  follow up MRI brain    #Abdominal Pain  - ongoing since  last visit in Jan 2024  - no new findings on exam, no palpable masses or nodularity  [ ]  follow up CT C/A/P to assess for recurrent disease    # Stage 1A FIGO grade 1, MMR-D endometrioid adenocarcinoma   - patient seen by Cancer Genetic Screening team and was found to have no hereditary pathogenic variants, one VUS was identified  - pending results of CT, continue routine surveillance and consider spacing out visits to yearly  - will discuss ongoing follow up at next visit (VV) in 1 month  [ ]  1 month VV f/u        Patient seen and evaluated with Dr. Onalee Hua, attending physician    Hal Morales, MD (PGY-4)  Obstetrics, Gynecology & Reproductive Sciences  [facility] [place] [place]  11/27/22

## 2022-11-27 NOTE — Progress Notes (Incomplete)
[facility]  Follow-Up Visit     ID: 67 y.o. female [phone] with Stage 1A FIGO grade 1, MMR-D endometrioid adenocarcinoma s/p RA-TLH, BSO 01/05/21, presenting for in-person follow-up.     Subjective    Jacqueline Mclaughlin is a 67 y.o. female who presents:     Interpreter: Available -  Name and/or ID: Donata Clay, [id number]  Interpreter Source: Telephone Interpreter  Language: Spanish    History of Present Illness   67 y.o. female [phone] with Stage 1A FIGO grade 1, MMR-D endometrioid adenocarcinoma s/p RA-TLH, BSO 01/05/21, presenting for in-person follow-up.     8/21:   - First episode of PMB. TVUS and pap smear normal.   -TVUS: Normal endometrial thickness of 3 mm. Several intramural fibroids, largest 3.0 cm in the fundus. 1.4 cm simple right ovarian cyst.     12/21:   - TVUS and EMB normal     07/11/20:  - Presents to ED for intermittent abdominal pain and spotting since August 2021  -TVUS: subcentimeter lesion in cervical canal, endometrial thickness 6 mm, 2 small fibroids  -CEA is 0.5. CA125 is 16. Plactin normal.     07/19/20:   - Referred to [facility] for continued PMB and pelvic pain. Continued bleeding since August (lasts up to 5 days, light to medium flow, cramping). Found to have a cervical lesion on exam. EMB performed.     07/21/20:   - Endometrial biopsy: Extremely scant benign fragmented surface endometrial glandular epithelium. Benign endocervical glandular tissue with microglandular hyperplasia. No hyperplasia, dysplasia or malignancy.  - Endocervix, biopsy: Benign endocervical glandular tissue. Benign ectocervical squamous epithelium. No dysplasia     09/12/20:   - Continued intermittent bleeding. PCP recommended repeat US. Patient sought GYN input. Scheduled for Pelvic exam under anesthesia, cervical dilation, hysteroscopy, endocervical and endometrial curettage, possible hysteroscopic polypectomy and/or myomectomy     10/27/20:  - Pelvic EUA,  hysteroscopy D&C, polypectomy   - Uterus,  endometrium, curettage: Endometrioid adenocarcinoma, FIGO grade 1-2. Sections of the endometrium curettage and polypectomy demonstrate an adenocarcinoma with tubular, cribriform and focal solid (5-10%) growth pattern. The tumor cells show moderate pleomorphism with enlarged hyperchromatic nuclei and small to medium amount of eosinophilic cytoplasm. Fragments of benign endometrial polyps are also noted. Immunostains are performed on tissue block A2 as follows: P53: Wild-type pattern, ER: Positive, diffuse and strong, Napsin A: Mostly negative.      MLH1 (M1): Positive, intact nuclear staining  PMS2 (A16-4): Positive, intact nuclear staining  MSH2 ([id number]): Negative, loss of nuclear staining  MSH6 (GM01): Positive, intact nuclear staining     Loss of nuclear staining in MSH2 only. This pattern is unusual, and may represent heterogeneity of tumor in the limited biopsy material. Repeat mismatch repair proteins testing in final excision is recommended to further rule out possible Lynch syndrome.  - Uterus, endocervix, curettage: mostly mucoid material. Scant benign endocervical glands.     11/06/20: [facility]  - Staging CT of chest abdomen pelvis w/ contrast, s/p hysteroscopy:  Unremarkable chest CT.Myomatous uterus with interval reduction in the size of the endometrial cavity. No abnormal lymphadenopathy or peritoneal spread identified.Colonic diverticulosis    01/05/21  RA-TLH, BSO, pelvic washings, uncomplicated. Pathology with Stage 1A Grade 1 endometrioid carcinoma, 43% myometrial invasion, no LVSI. Washings benign. Negative for MLH1 promoter methylation.      01/24/21 Video visit with Dr. Onalee Hua, doing well post-operatively.     04/11/21, patient was doing well. Referred to Cancer  Genetics and GI for colonoscopy.    On 05/10/2021, colonoscopy notable for one transverse and one descending colon polyps.     10/10/21, Doing well since last visit.  No VB, abd pain, bloating, n/v, appetite  changes, early satiety, bladder or bowel changes, fatigue, fevers/chills/night sweats. Endorses some weight loss since surgery (180lbs preop > 165lbs today) iso increased exercise.  Since last visit, she had a colonoscopy that revealed two 3-mm polyps. Pathology report indicates both are benign.    Today 04/10/2022, patient is feeling overall okay but worried because she was supposed to have scans and did not have. She has been having pain her lower to mid abdomen for about 3 weeks to 1 month. Comes and goes, not able to discern an inciting factor. Patient also endorsing that she is having abnormal vaginal discharge that she says is yellow in color and watery and has been there for about 1 month. Denies vaginal bleeding. Denies Nausea/ vomiting. Eating well, denies early satiety. Denies fevers, chills. Denies constipation/diarrhea. Denies dysuria, frequency, urgency.       Review of Systems   Constitutional:  Negative for chills, fever and weight loss.   Respiratory:  Negative for cough and shortness of breath.    Cardiovascular:  Negative for chest pain and palpitations.   Gastrointestinal:  Positive for abdominal pain. Negative for constipation, diarrhea, nausea and vomiting.   Genitourinary:  Negative for dysuria, frequency and urgency.   Neurological:  Negative for dizziness and headaches.   Psychiatric/Behavioral: Negative.       Health care maintenance  - Pap: 02/02/2020, NILM, HPV negative  - Mammogram: 10/03/2020, normal  - Colonoscopy: 05/09/2021, two 3-mm polyps removed, both benign  - DEXA: never    Objective             Physical exam  Gen: well appearing, NAD  HEENT: no supraclavicular LAD  CV: regular rate and rhythm   Pulm: anterior lung fields CTAB  Abd: Soft, non-distended, non-tender. No palpable masses. Well-healed laparoscopic incisions and midline vertical incision.   Pelvic: Speculum exam with normal vaginal tissue, cuff visualized without lesions or defects. BME with in tact vaginal cuff, no  masses palpated. Rectovaginal exam smooth.  Ext: symmetric, non-edematous     Pathology    01/05/21 Hysterectomy Pathology  FINAL PATHOLOGIC DIAGNOSIS     Uterus with adnexa, hysterectomy and bilateral salpingo-oophorectomy:  - Endometrium: Endometrioid carcinoma, grade 1, invading inner  myometrium.  - Myometrium:   1. 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  2. Leiomyomas.  - Cervix: Nabothian cysts.  - Serosa: Adhesions.  - Left fallopian tube: Paratubal cysts.  - Left ovary: Surface adhesions.  - Right fallopian tube: No significant pathologic abnormality.   - Right ovary: Cystic corpus luteum.       COMMENT:   Endometrial Cancer Synoptic Report     Procedures:       -Hysterectomy: Total hysterectomy.  -Ovaries: Bilateral oophorectomies.   -Fallopian tubes: Bilateral salpingectomies.  -Omentectomy: Not performed.  -Peritoneal sampling: Not performed.  -Pelvic washings: Performed.  -Vaginal cuff resection:  Not performed.  Histologic type: Endometrioid carcinoma.  Histologic grade: FIGO grade 1.   Myometrial invasion by tumor: Present.       - Depth of invasion: 0.6 cm (measured on slide A5).  - Thickness of myometrium:  1.4 cm.  - Percent of myometrium invaded:  43%.  Cervical stromal invasion by tumor: None.  Uterine serosal involvement by tumor: None.  Other organ involvement by tumor: None.  Lymphatic/vascular invasion: None.  Tumor size: <1 cm x <1 cm surface area; 0.6 cm thickness.  Pelvic lymph nodes:   -Total number (including sentinel nodes) examined:  0.        Para-aortic lymph nodes:   -Total number (including sentinel nodes) examined: 0.         Ectocervical / vaginal cuff margin: Negative.  Paracervical / parametrial margin: Negative.  Peritoneal / ascitic fluid cytology: Benign.  Distant metastasis: None.  AJCC (8th edition) pathologic stage:  -pT: 1a.  -pN: X.  -pM: X.   FIGO stage: IA.       Assessment and Plan      67 y.o. female [id number] Stage 1A FIGO grade 1, MMR-D endometrioid  adenocarcinoma s/p RA-TLH, BSO 01/05/21, undergoing surveillance and presenting for in-person follow-up.     # Stage 1A FIGO grade 1, MMR-D endometrioid adenocarcinoma   FIGO grade 1, stage 1A disease with <50% myometrial invasion, no LVSI, s/p RA TLH BSO 01/05/2021.  She has no high-risk features as defined by GOG and one high-risk feature (age) as defined by PORTEC. As such, no indication for adjuvant radiation and would recommend surveillance.   - discussed ongoing concern for hereditary cancer syndrome given tumor [facility] deficient, discussed importance for patient to be evaluated in hereditary cancer and genetic clinic, especially given possible implications for her children.   [ ]  Follow up with Cancer Genetic screening - referral placed today  [ ]  return to clinic in 3 months to discuss Cancer Genetic Clinic evaluation/recommendations    #Vaginal Discharge  - Likely leaking urine, Speculum exam today without visible vaginal discharge or bleeding. Cuff intact without visible lesions.   - Patient with irritation of perineum and saddle area consistent with moisture on exam    #Abdominal Pain  - Discussed option with patient to repeat imaging now or continue to monitor the pain  - Patient opting to monitor and if persistent abdominal pain in 1-2 months will recommend repeat imaging.   [ ]  CTM    RTC in 3 mo for surveillance visit     Discussed with Gyn Onc attending physician Dr. Onalee Hua, who formulated the plan and led the discussion     Alvis Lemmings MD, MPH  Obstetrics, Gynecology & RS, PGY1  [facility]  Care Phone: [phone]  11/26/22

## 2022-12-26 ENCOUNTER — Telehealth: Payer: MEDICARE | Attending: Gynecologic Oncology | Primary: Physician

## 2022-12-26 DIAGNOSIS — C541 Malignant neoplasm of endometrium: Secondary | ICD-10-CM

## 2022-12-26 NOTE — Progress Notes (Unsigned)
[facility]  Follow-Up Visit     ID: 67 y.o. female [phone] with Stage 1A FIGO grade 1, MMR-D (lynch negative) endometrioid adenocarcinoma s/p RA-TLH, BSO 01/05/21, presenting for surveillance via video follow-up.     Subjective    Jacqueline Mclaughlin is a 67 y.o. female who presents:       Interpreter: Available -  Name and/or ID: Arlys John  Interpreter Source: Telephone Interpreter  Language: Spanish    History of Present Illness   68 y.o. 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     8/21:   - First episode of PMB. TVUS and pap smear normal.   -TVUS: Normal endometrial thickness of 3 mm. Several intramural fibroids, largest 3.0 cm in the fundus. 1.4 cm simple right ovarian cyst.     12/21:   - TVUS and EMB normal     07/11/20:  - Presents to ED for intermittent abdominal pain and spotting since August 2021  -TVUS: subcentimeter lesion in cervical canal, endometrial thickness 6 mm, 2 small fibroids  -CEA is 0.5. CA125 is 16. Plactin normal.     07/19/20:   - Referred to [facility] for continued PMB and pelvic pain. Continued bleeding since August (lasts up to 5 days, light to medium flow, cramping). Found to have a cervical lesion on exam. EMB performed.     07/21/20:   - Endometrial biopsy: Extremely scant benign fragmented surface endometrial glandular epithelium. Benign endocervical glandular tissue with microglandular hyperplasia. No hyperplasia, dysplasia or malignancy.  - Endocervix, biopsy: Benign endocervical glandular tissue. Benign ectocervical squamous epithelium. No dysplasia     09/12/20:   - Continued intermittent bleeding. PCP recommended repeat US. Patient sought GYN input. Scheduled for Pelvic exam under anesthesia, cervical dilation, hysteroscopy, endocervical and endometrial curettage, possible hysteroscopic polypectomy and/or myomectomy     10/27/20:  - Pelvic EUA,  hysteroscopy D&C,  polypectomy   - Uterus, endometrium, curettage: Endometrioid adenocarcinoma, FIGO grade 1-2. Sections of the endometrium curettage and polypectomy demonstrate an adenocarcinoma with tubular, cribriform and focal solid (5-10%) growth pattern. The tumor cells show moderate pleomorphism with enlarged hyperchromatic nuclei and small to medium amount of eosinophilic cytoplasm. Fragments of benign endometrial polyps are also noted. Immunostains are performed on tissue block A2 as follows: P53: Wild-type pattern, ER: Positive, diffuse and strong, Napsin A: Mostly negative.      MLH1 (M1): Positive, intact nuclear staining  PMS2 (A16-4): Positive, intact nuclear staining  MSH2 ([id number]): Negative, loss of nuclear staining  MSH6 (XB28): Positive, intact nuclear staining     Loss of nuclear staining in MSH2 only. This pattern is unusual, and may represent heterogeneity of tumor in the limited biopsy material. Repeat mismatch repair proteins testing in final excision is recommended to further rule out possible Lynch syndrome.  - Uterus, endocervix, curettage: mostly mucoid material. Scant benign endocervical glands.     11/06/20: [facility]  - Staging CT of chest abdomen pelvis w/ contrast, s/p hysteroscopy:  Unremarkable chest CT.Myomatous uterus with interval reduction in the size of the endometrial cavity. No abnormal lymphadenopathy or peritoneal spread identified.Colonic diverticulosis    01/05/21  RA-TLH, BSO, pelvic washings, uncomplicated. Pathology with Stage 1A Grade 1 endometrioid carcinoma, 43% myometrial invasion, no LVSI. Washings benign. Negative for MLH1 promoter methylation.      01/24/21 Video visit with Dr. Onalee Hua, doing well post-operatively.     04/11/21, patient was  doing well. Referred to Cancer Genetics and GI for colonoscopy.    On 05/10/2021, colonoscopy notable for one transverse and one descending colon polyps.     10/10/21, Doing well since last visit.  No VB, abd pain,  bloating, n/v, appetite changes, early satiety, bladder or bowel changes, fatigue, fevers/chills/night sweats. Endorses some weight loss since surgery (180lbs preop > 165lbs today) iso increased exercise.  Since last visit, she had a colonoscopy that revealed two 3-mm polyps. Pathology report indicates both are benign.    04/10/2022, doing well, intermittent abdominal pain, discussed recommendation for imaging and patient declined but would be amenable if pain continued. Was recommended to follow up in 3 months    07/10/22 - no show to clinic    Today, 11/27/22, she reports she has had a bad headache associated with nausea and vomiting for 3 weeks. Went to the ED in [facility] a few weeks ago and was treated for her nausea and got medicine to treat her blood pressure at that time. Has not experienced nausea since then but has experienced ongoing dizziness, feeling like the room is spinning. Headache is sometimes daily, but sometimes it goes away completely on its own. Sometimes the headache associated with blurry vision, and today she is having blurry vision. Usually wears glasses, but vision is still blurry even when she wears her glasses. Reports it has been over 2 years since her last visit to the eye doctor. Doesn't take any medicine for headaches, drinks tea which helps. Denies weakness or numbness in arms or legs. Has cramps in her legs sometimes when sleeping.    Reports she is feeling nervous about today's visit. She also reports she sometimes has abdominal pain, in her groin area, doesn't think it is her kidneys, comes and goes. She thinks this is the same pain she was having in January. Would describe the pain as nerve pain, like when your leg goes to sleep and then is trying to wake again. The pain does not radiate down her legs, but is very strong inside deep inside her abdomen.     Reports increased fatigue. Eating normal amounts of food, denies early satiety and bloating. Reports normal bowel movements.  Denies dysuria. Denies unintentional weight loss.    Taking medications daily for her BP, but is unsure what it is called, because it was recently changed.      Review of Systems   Constitutional:  Positive for malaise/fatigue. Negative for chills, fever and weight loss.   Eyes:  Positive for blurred vision.   Respiratory:  Negative for cough and shortness of breath.    Cardiovascular:  Negative for chest pain and palpitations.   Gastrointestinal:  Positive for abdominal pain. Negative for blood in stool, constipation, diarrhea, nausea and vomiting.   Genitourinary:  Negative for dysuria, frequency and urgency.   Musculoskeletal:  Negative for falls.   Neurological:  Positive for dizziness and headaches. Negative for tingling, sensory change, focal weakness and weakness.   Psychiatric/Behavioral: Negative.       Health care maintenance  - Pap: 02/02/2020, NILM, HPV negative  - Mammogram: 10/03/2020, normal  - Colonoscopy: 05/09/2021, two 3-mm polyps removed, both benign  - DEXA: never    Objective                 Physical exam  Gen: well appearing, NAD  HEENT: no supraclavicular LAD  CV: regular rate and rhythm   Pulm: NWOB  Abd: Soft, non-distended, non-tender. No palpable masses.  Well-healed laparoscopic incisions and midline vertical incision.   Pelvic: Speculum exam with atrophic vaginal mucosa, redundant vaginal tissue, no lesions visualized. BME with in tact vaginal cuff, no masses palpated. Rectovaginal exam smooth and without nodularity.  Ext: symmetric, non-edematous     Pathology    01/05/21 Hysterectomy Pathology  FINAL PATHOLOGIC DIAGNOSIS     Uterus with adnexa, hysterectomy and bilateral salpingo-oophorectomy:  - Endometrium: Endometrioid carcinoma, grade 1, invading inner  myometrium.  - Myometrium:   1. 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  2. Leiomyomas.  - Cervix: Nabothian cysts.  - Serosa: Adhesions.  - Left fallopian tube: Paratubal cysts.  - Left ovary: Surface adhesions.  - Right  fallopian tube: No significant pathologic abnormality.   - Right ovary: Cystic corpus luteum.       COMMENT:   Endometrial Cancer Synoptic Report     Procedures:       -Hysterectomy: Total hysterectomy.  -Ovaries: Bilateral oophorectomies.   -Fallopian tubes: Bilateral salpingectomies.  -Omentectomy: Not performed.  -Peritoneal sampling: Not performed.  -Pelvic washings: Performed.  -Vaginal cuff resection:  Not performed.  Histologic type: Endometrioid carcinoma.  Histologic grade: FIGO grade 1.   Myometrial invasion by tumor: Present.       - Depth of invasion: 0.6 cm (measured on slide A5).  - Thickness of myometrium:  1.4 cm.  - Percent of myometrium invaded:  43%.  Cervical stromal invasion by tumor: None.  Uterine serosal involvement by tumor: None.  Other organ involvement by tumor: None.  Lymphatic/vascular invasion: None.  Tumor size: <1 cm x <1 cm surface area; 0.6 cm thickness.  Pelvic lymph nodes:   -Total number (including sentinel nodes) examined:  0.        Para-aortic lymph nodes:   -Total number (including sentinel nodes) examined: 0.         Ectocervical / vaginal cuff margin: Negative.  Paracervical / parametrial margin: Negative.  Peritoneal / ascitic fluid cytology: Benign.  Distant metastasis: None.  AJCC (8th edition) pathologic stage:  -pT: 1a.  -pN: X.  -pM: X.   FIGO stage: IA.       Assessment and Plan      67 y.o. female [id number] Stage 1A FIGO grade 1, MMR-D (lynch negative) endometrioid adenocarcinoma s/p RA-TLH, BSO 01/05/21, undergoing surveillance and presenting for in-person follow-up with ongoing intermittent abdominal pain and new headaches with dizziness.    #headaches, dizziness  - unclear etiology at this time, ddx includes medication side effect from recently changed blood pressure medications, benign vertigo, metastatic disease to brain  - no focal neurologic deficits today, which is overall reassuring  - patient advised to follow up with PCP given low BP in office today (SBP  30-40s)  [ ]  follow up MRI brain    #Abdominal Pain  - ongoing since last visit in Jan 2024  - no new findings on exam, no palpable masses or nodularity  [ ]  follow up CT C/A/P to assess for recurrent disease    # Stage 1A FIGO grade 1, MMR-D endometrioid adenocarcinoma   - patient seen by Cancer Genetic Screening team and was found to have no hereditary pathogenic variants, one VUS was identified  - pending results of CT, continue routine surveillance and consider spacing out visits to yearly  - will discuss ongoing follow up at next visit (VV) in 1 month  [ ]  1 month VV f/u        Patient seen and evaluated with Dr. Onalee Hua, attending  physician    Hal Morales, MD (PGY-4)  Obstetrics, Gynecology & Reproductive Sciences  [facility] [place], [place]  12/26/22

## 2023-02-06 NOTE — Telephone Encounter (Signed)
Left voice message or Odessia, [place] family,  to call us back in regard to CT and MRI.    [facility] [facility] and confirmed pt has MRI appt on 02/10/23. No CT appt.

## 2023-02-07 NOTE — Telephone Encounter (Signed)
Spoke with Byrd Hesselbach, patient's daughter, and they confirmed they may have already completed CT scan. Will send records request for CT and MRI 11/19 after MRI appt and schedule with Dr. Onalee Hua after to review results.

## 2023-05-14 ENCOUNTER — Telehealth: Admit: 2023-05-14 | Payer: MEDICARE | Attending: Gynecologic Oncology | Primary: Physician

## 2023-05-14 DIAGNOSIS — C541 Malignant neoplasm of endometrium: Secondary | ICD-10-CM

## 2023-05-14 NOTE — Progress Notes (Signed)
[facility]  Follow-Up Visit     ID: 68 y.o. 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     Subjective    Jacqueline Mclaughlin is a 68 y.o. female who presents:     History of Present Illness   68 y.o. 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     8/21:   - First episode of PMB. TVUS and pap smear normal.   -TVUS: Normal endometrial thickness of 3 mm. Several intramural fibroids, largest 3.0 cm in the fundus. 1.4 cm simple right ovarian cyst.     03/14/20:   - TVUS and EMB normal     07/11/20:  - Presents to ED for intermittent abdominal pain and spotting since August 2021  -TVUS: subcentimeter lesion in cervical canal, endometrial thickness 6 mm, 2 small fibroids  -CEA is 0.5. CA125 is 16. Plactin normal.     07/19/20:   - Referred to [facility] for continued PMB and pelvic pain. Continued bleeding since August (lasts up to 5 days, light to medium flow, cramping). Found to have a cervical lesion on exam. EMB performed.     07/21/20:   - Endometrial biopsy: Extremely scant benign fragmented surface endometrial glandular epithelium. Benign endocervical glandular tissue with microglandular hyperplasia. No hyperplasia, dysplasia or malignancy.  - Endocervix, biopsy: Benign endocervical glandular tissue. Benign ectocervical squamous epithelium. No dysplasia     09/12/20:   - Continued intermittent bleeding. PCP recommended repeat US. Patient sought GYN input. Scheduled for Pelvic exam under anesthesia, cervical dilation, hysteroscopy, endocervical and endometrial curettage, possible hysteroscopic polypectomy and/or myomectomy     10/27/20:  - Pelvic EUA,  hysteroscopy D&C, polypectomy   - Uterus, endometrium, curettage: Endometrioid adenocarcinoma, FIGO grade 1-2. Sections of the  endometrium curettage and polypectomy demonstrate an adenocarcinoma with tubular, cribriform and focal solid (5-10%) growth pattern. The tumor cells show moderate pleomorphism with enlarged hyperchromatic nuclei and small to medium amount of eosinophilic cytoplasm. Fragments of benign endometrial polyps are also noted. Immunostains are performed on tissue block A2 as follows: P53: Wild-type pattern, ER: Positive, diffuse and strong, Napsin A: Mostly negative.      MLH1 (M1): Positive, intact nuclear staining  PMS2 (A16-4): Positive, intact nuclear staining  MSH2 (A540-9811): Negative, loss of nuclear staining  MSH6 (BJ47): Positive, intact nuclear staining     Loss of nuclear staining in MSH2 only. This pattern is unusual, and may represent heterogeneity of tumor in the limited biopsy material. Repeat mismatch repair proteins testing in final excision is recommended to further rule out possible Lynch syndrome.  - Uterus, endocervix, curettage: mostly mucoid material. Scant benign endocervical glands.     11/06/20: [facility]  - Staging CT of chest abdomen pelvis w/ contrast, s/p hysteroscopy:  Unremarkable chest CT.Myomatous uterus with interval reduction in the size of the endometrial cavity. No abnormal lymphadenopathy or peritoneal spread identified. Colonic diverticulosis    01/05/21  RA-TLH, BSO, pelvic washings, uncomplicated. Pathology with Stage 1A Grade 1 endometrioid carcinoma, 43% myometrial invasion, no LVSI. Washings benign. Negative for MLH1 promoter methylation.      01/24/21 Video visit with Dr. Onalee Hua, doing well post-operatively.     04/11/21, patient was doing well. Referred to [facility] for colonoscopy.    On 05/10/2021, colonoscopy notable for one transverse  and one descending colon polyps.     10/10/21, Doing well since last visit. No VB, abd pain, bloating, n/v, appetite changes, early satiety, bladder or bowel changes, fatigue, fevers/chills/night sweats.  Endorses some weight loss since surgery (180lbs preop > 165lbs today) iso increased exercise. Since last visit, she had a colonoscopy that revealed two 3-mm polyps. Pathology report indicates both are benign.    04/10/2022, doing well, intermittent abdominal pain, discussed recommendation for imaging and patient declined but would be amenable if pain continued. Was recommended to follow up in 3 months    11/27/22, Missed prior appointment   - unclear etiology at this time, ddx includes medication side effect from recently changed blood pressure medications, benign vertigo, metastatic disease to brain  - no focal neurologic deficits today, which is overall reassuring  - patient advised to follow up with PCP given low BP in office today (SBP 30-40s)  [ ]  follow up MRI brain  #Abdominal Pain  - ongoing since last visit in Jan 2024  - no new findings on exam, no palpable masses or nodularity  [ ]  follow up CT C/A/P to assess for recurrent disease  # Stage 1A FIGO grade 1, MMR-D endometrioid adenocarcinoma   - patient seen by Cancer Genetic Screening team and was found to have no hereditary pathogenic variants, one VUS was identified  - pending results of CT, continue routine surveillance and consider spacing out visits to yearly  - will discuss ongoing follow up at next visit (VV) in 1 month  [ ]  1 month VV f/u    02/03/23: CTAP "Several too small to characterize hypodensities within the right hepatic lobe appear similar, likely small cyst. There is a heterogeneously enhancing 1.7 cm left hepatic lobe lesion, likely segment 4, which may have been visible on prior exam."  1. More conspicuous heterogeneously enhancing lesion within the left hepatic lobe. Indeterminate although favor a benign etiology perhaps hemangioma. Recommend confirmation with MRI of the abdomen with contrast given its new conspicuity.   2. Otherwise no CT evidence of metastatic disease within the chest, abdomen, pelvis.   3. There is a new small  anterior abdominal wall fat filled hernia above the umbilicus.   4. Interval hysterectomy.   5. Several additional chronic findings as above.     02/10/23: MR Brain IMPRESSION: 1. No acute or subacute ischemic change of the brain.   2. No evidence for metastatic spread of disease involving the brain, meninges. Postcontrast imaging negative. 3. Moderate scattered periventricular, subcortical areas of white matter disease. White matter disease can be related to chronic small vessel ischemic disease.  Correlate with any risk factors including any history of diabetes, hypertension, hypercholesterolemia, any history of alcohol, drug abuse, and/or tobacco abuse.       Today, presenting for follow-up. Just got back from [place], symptoms of headache and dizziness at last visit have since went away, glad to hear that there wasn't anything concerning on the MRI. Has not had any vaginal bleeding, abnormal discharge, hematuria, or stool incontinence. Appetite and energy are good, no unanticipated weight loss or fatigue. Notes some issues with having to get up to pee at night which is new for her, sometimes also with urgency but typically able to hold urine long enough to get to bathroom and only has rare leaking. Doesn't seem to be a problem during the day.     Also with some recent (unsure timeline, maybe a month or two) of shortness of breath when laying  down that comes and goes, not every night. Also gets short of breath when going up a flight of stairs but not necessarily with just walking around. No shortness of breath when sitting up at rest. Does not have any heart conditions she is aware of. Does note that when pregnant with last child (@23yo ) had to be hospitalized for a few weeks due to fluid in her lungs and that there may have been some around heart too. Was also told that she had high blood pressure at that time. Resolved after delivery and no issues since.   No lower extremity edema, chest pain, cough, hemoptysis,  weight loss, fevers, chills.       ROS  Per HPI    Health care maintenance  - Pap: 02/02/2020, NILM, HPV negative  - Mammogram: 10/03/2020, normal  - Colonoscopy: 05/09/2021, two 3-mm polyps removed, both benign  - DEXA: never    Objective      Physical exam  Video Visit    Pathology    01/05/21 Hysterectomy Pathology  FINAL PATHOLOGIC DIAGNOSIS     Uterus with adnexa, hysterectomy and bilateral salpingo-oophorectomy:  - Endometrium: Endometrioid carcinoma, grade 1, invading inner  myometrium.  - Myometrium:   1. 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  2. Leiomyomas.  - Cervix: Nabothian cysts.  - Serosa: Adhesions.  - Left fallopian tube: Paratubal cysts.  - Left ovary: Surface adhesions.  - Right fallopian tube: No significant pathologic abnormality.   - Right ovary: Cystic corpus luteum.       COMMENT:   Endometrial Cancer Synoptic Report     Procedures:       -Hysterectomy: Total hysterectomy.  -Ovaries: Bilateral oophorectomies.   -Fallopian tubes: Bilateral salpingectomies.  -Omentectomy: Not performed.  -Peritoneal sampling: Not performed.  -Pelvic washings: Performed.  -Vaginal cuff resection:  Not performed.  Histologic type: Endometrioid carcinoma.  Histologic grade: FIGO grade 1.   Myometrial invasion by tumor: Present.       - Depth of invasion: 0.6 cm (measured on slide A5).  - Thickness of myometrium:  1.4 cm.  - Percent of myometrium invaded:  43%.  Cervical stromal invasion by tumor: None.  Uterine serosal involvement by tumor: None.  Other organ involvement by tumor: None.  Lymphatic/vascular invasion: None.  Tumor size: <1 cm x <1 cm surface area; 0.6 cm thickness.  Pelvic lymph nodes:   -Total number (including sentinel nodes) examined:  0.        Para-aortic lymph nodes:   -Total number (including sentinel nodes) examined: 0.         Ectocervical / vaginal cuff margin: Negative.  Paracervical / parametrial margin: Negative.  Peritoneal / ascitic fluid cytology: Benign.  Distant metastasis:  None.  AJCC (8th edition) pathologic stage:  -pT: 1a.  -pN: X.  -pM: X.   FIGO stage: IA.       Assessment and Plan      68 y.o. 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    Amberrose has been doing very well during surveillance these past two years with normal pelvic exams (last 11/2022) and recent CTAP (done for abdominal pain) that showed NED.     Further oncologic follow-up not indicated given time from surgical treatment, early stage disease, and no signs of recurrence for 2 years of surveillance. However, return precautions reviewed with patient for any signs/symptoms of recurrence. Also  strongly recommended seeing primary care physician for general health care maintenance.          Patient seen and evaluated with Dr. Onalee Hua, attending physician    CJ Luther Parody) Encarnacion Chu, MD PGY2  Obstetrics, Gynecology, & Reproductive Sciences  [place] [facility] [place]  05/14/23

## 2023-05-14 NOTE — H&P (Deleted)
[name]  Follow-Up Visit     ID: 68 y.o. 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     Subjective    Jacqueline Mclaughlin is a 68 y.o. female who presents:     Interpreter: Available -  Name and/or ID:   Interpreter Source: Telephone Interpreter  Language: Spanish    History of Present Illness   68 y.o. 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     8/21:   - First episode of PMB. TVUS and pap smear normal.   -TVUS: Normal endometrial thickness of 3 mm. Several intramural fibroids, largest 3.0 cm in the fundus. 1.4 cm simple right ovarian cyst.     12/21:   - TVUS and EMB normal     07/11/20:  - Presents to ED for intermittent abdominal pain and spotting since August 2021  -TVUS: subcentimeter lesion in cervical canal, endometrial thickness 6 mm, 2 Mclaughlin fibroids  -CEA is 0.5. CA125 is 16. Plactin normal.     07/19/20:   - Referred to [facility] for continued PMB and pelvic pain. Continued bleeding since August (lasts up to 5 days, light to medium flow, cramping). Found to have a cervical lesion on exam. EMB performed.     07/21/20:   - Endometrial biopsy: Extremely scant benign fragmented surface endometrial glandular epithelium. Benign endocervical glandular tissue with microglandular hyperplasia. No hyperplasia, dysplasia or malignancy.  - Endocervix, biopsy: Benign endocervical glandular tissue. Benign ectocervical squamous epithelium. No dysplasia     09/12/20:   - Continued intermittent bleeding. PCP recommended repeat US. Patient sought GYN input. Scheduled for Pelvic exam under anesthesia, cervical dilation, hysteroscopy, endocervical and endometrial curettage, possible hysteroscopic polypectomy and/or myomectomy     10/27/20:  - Pelvic EUA,  hysteroscopy D&C, polypectomy    - Uterus, endometrium, curettage: Endometrioid adenocarcinoma, FIGO grade 1-2. Sections of the endometrium curettage and polypectomy demonstrate an adenocarcinoma with tubular, cribriform and focal solid (5-10%) growth pattern. The tumor cells show moderate pleomorphism with enlarged hyperchromatic nuclei and Mclaughlin to medium amount of eosinophilic cytoplasm. Fragments of benign endometrial polyps are also noted. Immunostains are performed on tissue block A2 as follows: P53: Wild-type pattern, ER: Positive, diffuse and strong, Napsin A: Mostly negative.      MLH1 (M1): Positive, intact nuclear staining  PMS2 (A16-4): Positive, intact nuclear staining  MSH2 (A540-9811): Negative, loss of nuclear staining  MSH6 (BJ47): Positive, intact nuclear staining     Loss of nuclear staining in MSH2 only. This pattern is unusual, and may represent heterogeneity of tumor in the limited biopsy material. Repeat mismatch repair proteins testing in final excision is recommended to further rule out possible Lynch syndrome.  - Uterus, endocervix, curettage: mostly mucoid material. Scant benign endocervical glands.     11/06/20: [facility]  - Staging CT of chest abdomen pelvis w/ contrast, s/p hysteroscopy:  Unremarkable chest CT.Myomatous uterus with interval reduction in the size of the endometrial cavity. No abnormal lymphadenopathy or peritoneal spread identified.Colonic diverticulosis    01/05/21  RA-TLH, BSO, pelvic washings, uncomplicated. Pathology with Stage 1A Grade 1 endometrioid carcinoma, 43% myometrial invasion, no LVSI. Washings benign. Negative for MLH1 promoter methylation.      01/24/21 Video visit with Dr. Onalee Hua, doing well post-operatively.     04/11/21, patient was doing well.  Referred to [facility] for colonoscopy.    On 05/10/2021, colonoscopy notable for one transverse and one descending colon polyps.     10/10/21, Doing well since last visit.  No VB, abd pain, bloating,  n/v, appetite changes, early satiety, bladder or bowel changes, fatigue, fevers/chills/night sweats. Endorses some weight loss since surgery (180lbs preop > 165lbs today) iso increased exercise.  Since last visit, she had a colonoscopy that revealed two 3-mm polyps. Pathology report indicates both are benign.    04/10/2022, doing well, intermittent abdominal pain, discussed recommendation for imaging and patient declined but would be amenable if pain continued. Was recommended to follow up in 3 months    07/10/22 - no show to clinic    11/27/22, s#headaches, dizziness  - unclear etiology at this time, ddx includes medication side effect from recently changed blood pressure medications, benign vertigo, metastatic disease to brain  - no focal neurologic deficits today, which is overall reassuring  - patient advised to follow up with PCP given low BP in office today (SBP 30-40s)  [ ]  follow up MRI brain  #Abdominal Pain  - ongoing since last visit in Jan 2024  - no new findings on exam, no palpable masses or nodularity  [ ]  follow up CT C/A/P to assess for recurrent disease  # Stage 1A FIGO grade 1, MMR-D endometrioid adenocarcinoma   - patient seen by Cancer Genetic Screening team and was found to have no hereditary pathogenic variants, one VUS was identified  - pending results of CT, continue routine surveillance and consider spacing out visits to yearly  - will discuss ongoing follow up at next visit (VV) in 1 month  [ ]  1 month VV f/u    02/03/23: CTAP "Several too Mclaughlin to characterize hypodensities within the right hepatic lobe appear similar, likely Mclaughlin cyst. There is a heterogeneously enhancing 1.7 cm left hepatic lobe lesion, likely segment 4, which may have been visible on prior exam."  1. More conspicuous heterogeneously enhancing lesion within the left hepatic lobe. Indeterminate although favor a benign etiology perhaps hemangioma. Recommend confirmation with MRI of the abdomen with contrast given its new  conspicuity.   2. Otherwise no CT evidence of metastatic disease within the chest, abdomen, pelvis.   3. There is a new Mclaughlin anterior abdominal wall fat filled hernia above the umbilicus.   4. Interval hysterectomy.   5. Several additional chronic findings as above.     02/10/23: MR Brain IMPRESSION: 1. No acute or subacute ischemic change of the brain.   2. No evidence for metastatic spread of disease involving the brain, meninges. Postcontrast imaging negative. 3. Moderate scattered periventricular, subcortical areas of white matter disease. White matter disease can be related to chronic Mclaughlin vessel ischemic disease.  Correlate with any risk factors including any history of diabetes, hypertension, hypercholesterolemia, any history of alcohol, drug abuse, and/or tobacco abuse.     Today, presenting for follow-up.     ROS        Health care maintenance  - Pap: 02/02/2020, NILM, HPV negative  - Mammogram: 10/03/2020, normal  - Colonoscopy: 05/09/2021, two 3-mm polyps removed, both benign  - DEXA: never    Objective          Physical exam  Video Visit    Pathology    01/05/21 Hysterectomy Pathology  FINAL PATHOLOGIC DIAGNOSIS     Uterus with adnexa, hysterectomy and bilateral salpingo-oophorectomy:  - Endometrium: Endometrioid carcinoma, grade 1, invading inner  myometrium.  - Myometrium:   1. 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  2. Leiomyomas.  - Cervix: Nabothian cysts.  - Serosa: Adhesions.  - Left fallopian tube: Paratubal cysts.  - Left ovary: Surface adhesions.  - Right fallopian tube: No significant pathologic abnormality.   - Right ovary: Cystic corpus luteum.       COMMENT:   Endometrial Cancer Synoptic Report     Procedures:       -Hysterectomy: Total hysterectomy.  -Ovaries: Bilateral oophorectomies.   -Fallopian tubes: Bilateral salpingectomies.  -Omentectomy: Not performed.  -Peritoneal sampling: Not performed.  -Pelvic washings: Performed.  -Vaginal cuff resection:  Not performed.  Histologic  type: Endometrioid carcinoma.  Histologic grade: FIGO grade 1.   Myometrial invasion by tumor: Present.       - Depth of invasion: 0.6 cm (measured on slide A5).  - Thickness of myometrium:  1.4 cm.  - Percent of myometrium invaded:  43%.  Cervical stromal invasion by tumor: None.  Uterine serosal involvement by tumor: None.  Other organ involvement by tumor: None.  Lymphatic/vascular invasion: None.  Tumor size: <1 cm x <1 cm surface area; 0.6 cm thickness.  Pelvic lymph nodes:   -Total number (including sentinel nodes) examined:  0.        Para-aortic lymph nodes:   -Total number (including sentinel nodes) examined: 0.         Ectocervical / vaginal cuff margin: Negative.  Paracervical / parametrial margin: Negative.  Peritoneal / ascitic fluid cytology: Benign.  Distant metastasis: None.  AJCC (8th edition) pathologic stage:  -pT: 1a.  -pN: X.  -pM: X.   FIGO stage: IA.       Assessment and Plan      68 y.o. 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          \  Patient seen and evaluated with Dr. Onalee Hua, attending physician      CJ Luther Parody) Encarnacion Chu, MD PGY2  Obstetrics, Gynecology, & Reproductive Sciences  [place] [facility] [place]  05/14/23

## 2023-05-14 NOTE — Addendum Note (Signed)
 Addended by: Gweneth Fritter on: 05/21/2023 06:14 PM     Modules accepted: Orders, Level of Service

## 2023-08-24 LAB — SAPIO LEGACY CONVERTED DATA: Encounter Number: 180536680

## 2023-11-05 IMAGING — MR [HOSPITAL]^JOELHO
7 series · 40 of 40 positions shown · non-contrast
Comparison: none

[Series 3: sagital dp fat · sagittal · right · 4.0mm · 0.50mm/px · 5 of 20 slices shown]
[im 1/20]
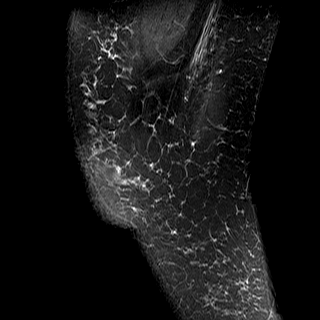
[im 5/20]
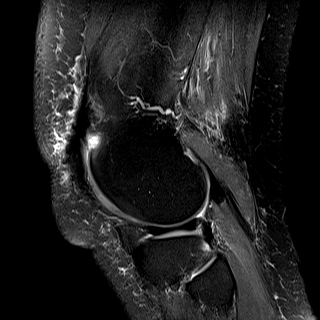
[im 10/20]
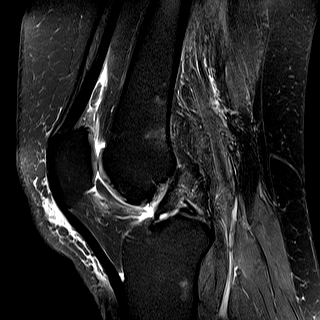
[im 15/20]
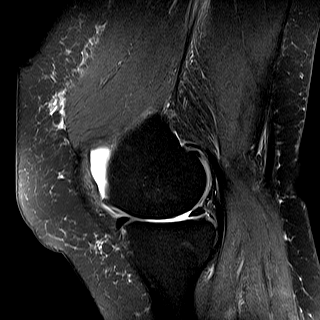
[im 20/20]
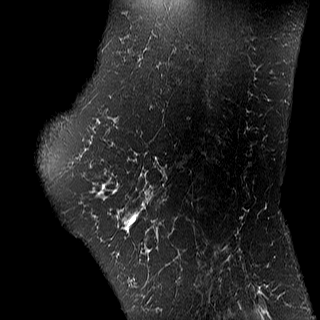

[Series 4: sagital dp · sagittal · right · 4.0mm · 0.36mm/px · 5 of 20 slices shown]
[im 1/20]
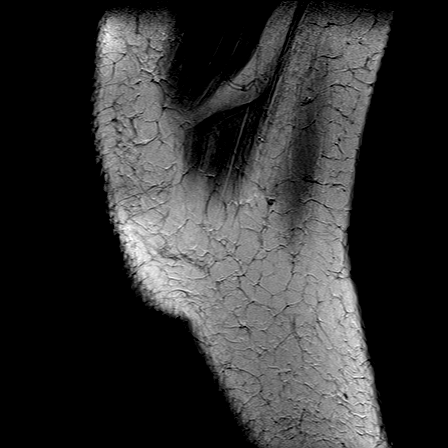
[im 5/20]
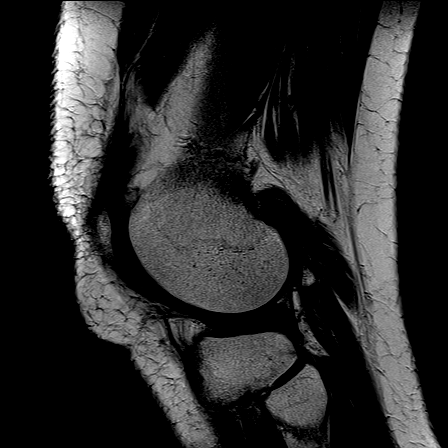
[im 10/20]
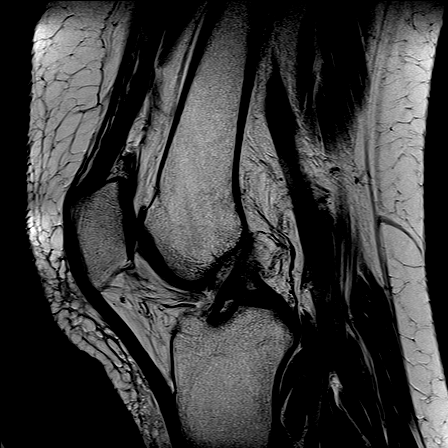
[im 15/20]
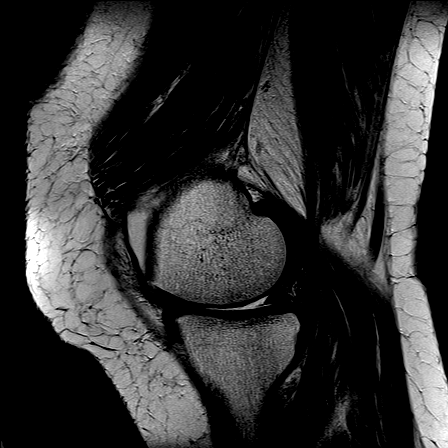
[im 20/20]
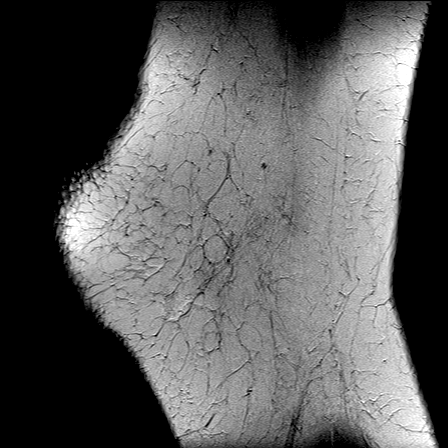

[Series 5: T1 · coronal · right · 3.5mm · 0.50mm/px · 6 of 23 slices shown]
[im 1/23]
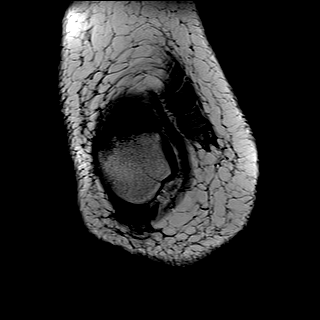
[im 5/23]
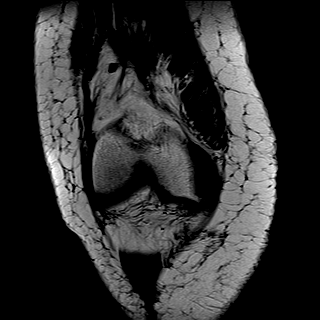
[im 9/23]
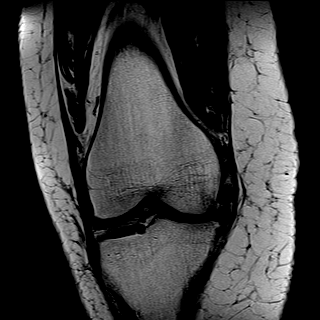
[im 14/23]
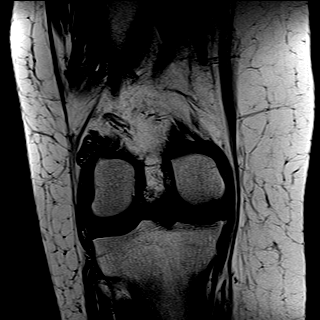
[im 18/23]
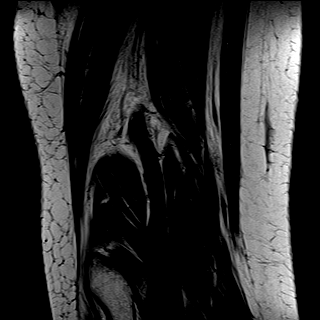
[im 23/23]
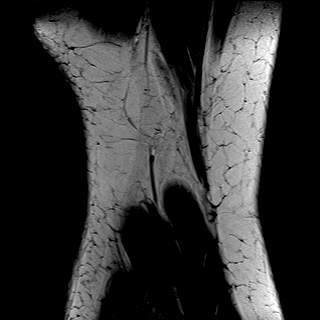

[Series 6: cor dp fat · coronal · right · 3.5mm · 0.62mm/px · 6 of 23 slices shown]
[im 1/23]
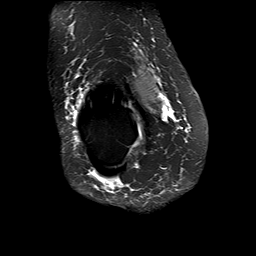
[im 5/23]
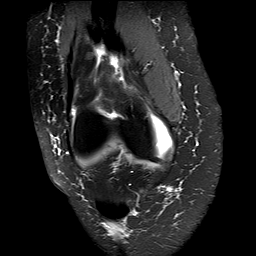
[im 9/23]
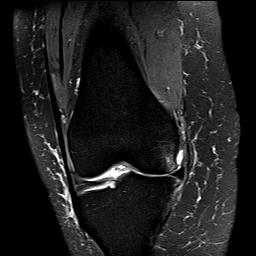
[im 14/23]
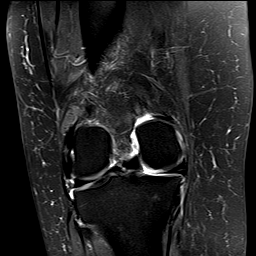
[im 18/23]
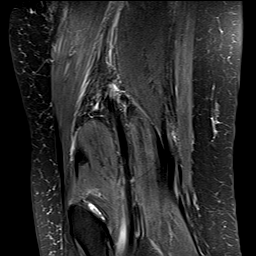
[im 23/23]
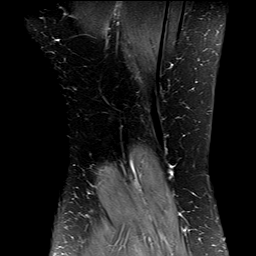

[Series 7: axial dp fat · axial · right · 4.0mm · 0.50mm/px · z∈[-68,+73]mm · 7 of 28 slices shown (1 of 2)]
[im 1/28]
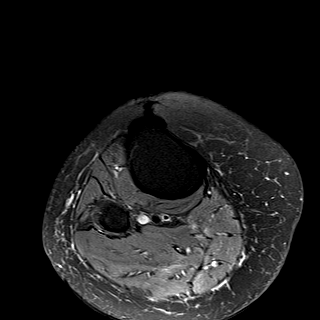
[im 5/28]
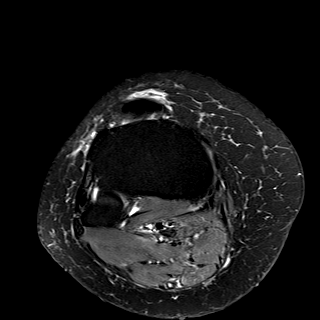
[im 10/28]
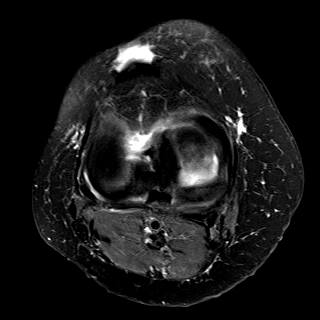
[im 14/28]
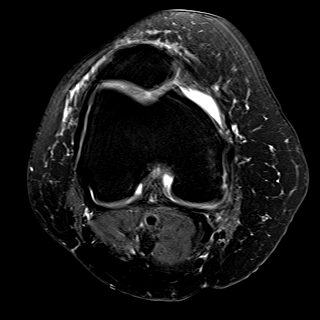
[im 19/28]
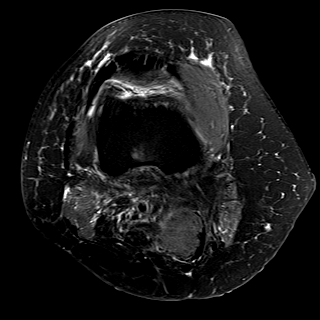
[im 23/28]
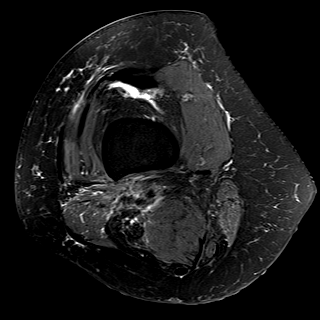
[im 28/28]
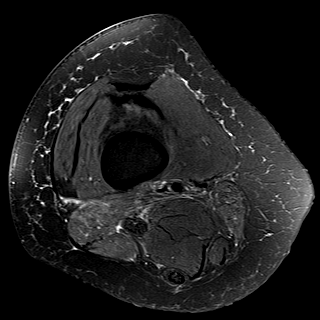

[Series 8: T2 · oblique · right · 2.0mm · 0.50mm/px · 4 of 14 slices shown]
[im 1/14]
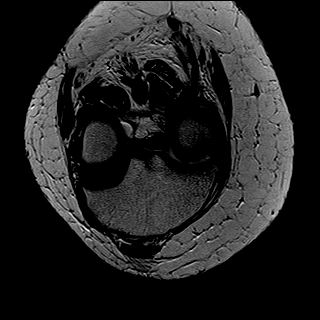
[im 5/14]
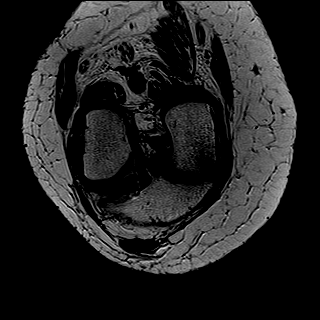
[im 9/14]
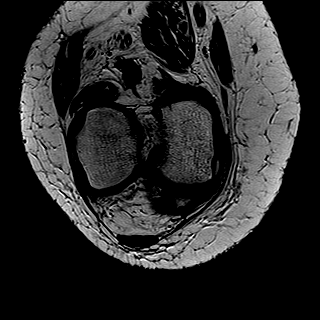
[im 14/14]
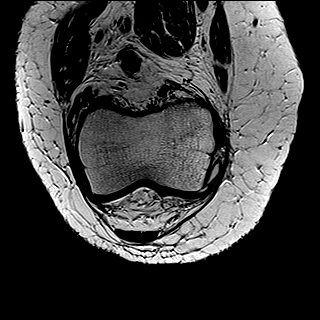

[Series 100: axial dp fat · axial · right · 4.0mm · 0.50mm/px · z∈[-68,+73]mm · 7 of 28 slices shown (2 of 2)]
[im 1/28]
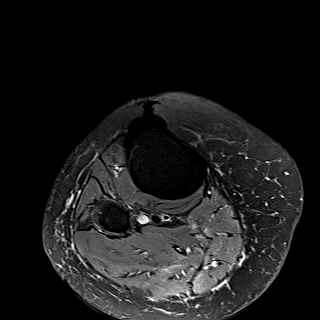
[im 5/28]
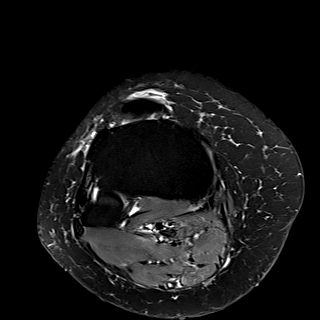
[im 10/28]
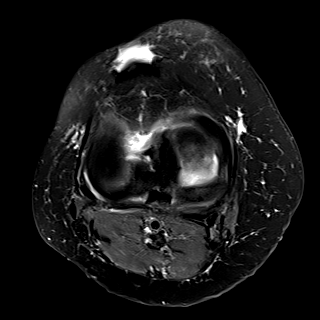
[im 14/28]
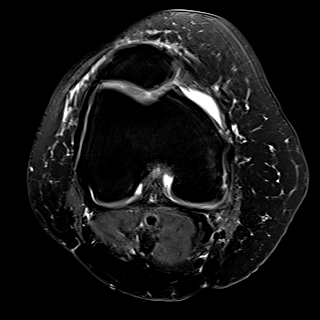
[im 19/28]
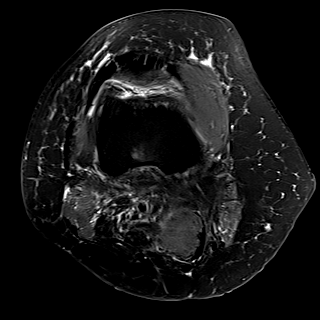
[im 23/28]
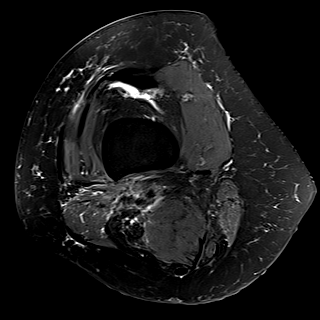
[im 28/28]
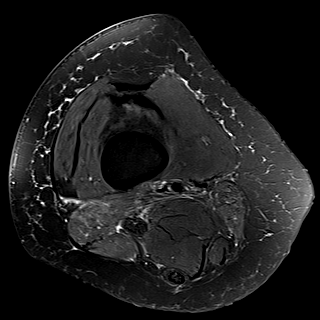

[40 of 40 positions shown; findings below may reference images not displayed]

Técnica de exame:
Aquisições multiplanares em aparelho de 1,5 Tesla, sem contraste venoso.
Aspectos observados:
Pequeno derrame articular.
Osteoartrose  femoropatelar  leve  caracterizada  por  discretos  osteóﬁtos  associados  a  redução  da  espessura  e  sinal
heterogêneo das cartilagens, havendo erosões no sulco e faceta medial da tróclea acompanhadas de pequenos focos
RESSONÂNCIA MAGNÉTICA DO JOELHO DIREITO
de edema no osso subcondral.
Osteoartrose  femorotibial  medial  moderada  caracterizada  por  osteóﬁtos  associados  adelgaçamento  e  erosões  nas
cartilagens, havendo pequenas áreas de edema no osso subcondral.
Menisco  medial  heterogêneo  havendo  adelgaçamento  da  porção  interna  do  corno  posterior,  adjacente  à  raiz,
indicando lesão parcial.
Menisco lateral íntegro, com morfologia normal e sinal levemente heterogêneo.
Ligamentos cruzados e colaterais íntegros.
Gordura de [place] com sinal normal. Coxim adiposo suprapatelar preservados. Plicas sinoviais sem anormalidades.
Retináculos delineados.
Tendões  e  porção  distal  do  trato  ílio-tibial  íntegros,  sem  alterações  signiﬁcativas  de  sinal.  Grupos  musculares
eutróﬁcos.
Bursa pré-patelar superﬁcial distendida, contendo discreto edema adjacente.
Bursas poplítea e da pata de ganso levemente espessadas.
Tela subcutânea contendo edema.
Daiana Franca Ingunn Harpa Ronlor
Opinião:
Pequeno derrame articular.
Osteoartrose femoropatelar leve, havendo erosões no sulco e faceta medial da tróclea acompanhadas de pequenos
focos de edema no osso subcondral.
Osteoartrose femorotibial medial moderada.
Lesão da porção mais interna do corno posterior do menisco medial.
Bursa pré-patelar superﬁcial distendida, contendo discreto edema adjacente.
Bursas poplítea e da pata de ganso levemente espessadas.
Daiana Franca Ingunn Harpa Ronlor

## 2023-11-05 IMAGING — MR [[facility]]^[facility]
6 series · 40 of 40 positions shown · non-contrast
Comparison: none

[Series 3: sagital dp fat · sagittal · left · 4.0mm · 0.50mm/px · 6 of 20 slices shown]
[im 1/20]
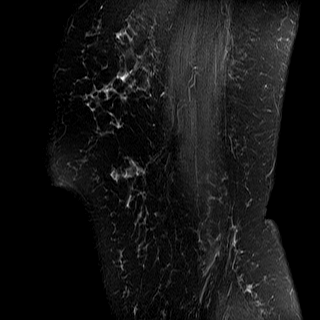
[im 4/20]
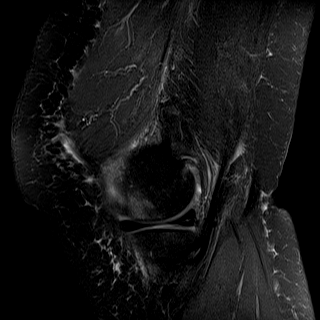
[im 8/20]
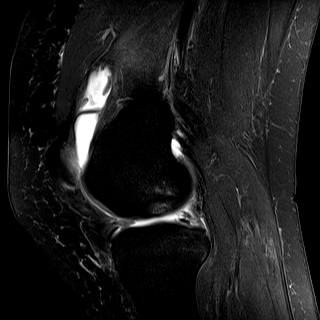
[im 12/20]
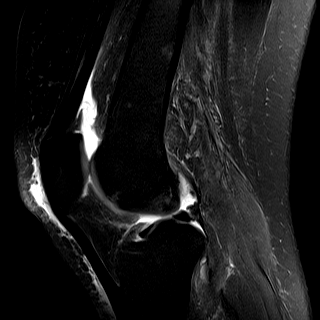
[im 16/20]
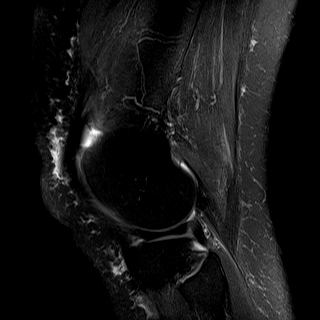
[im 20/20]
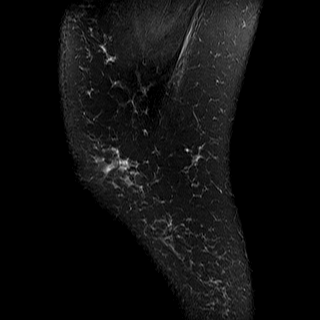

[Series 4: sagital dp · sagittal · left · 4.0mm · 0.36mm/px · 6 of 20 slices shown]
[im 1/20]
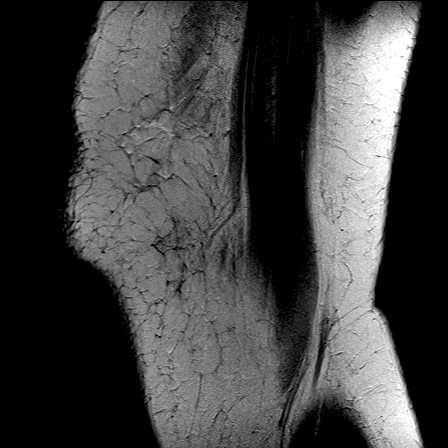
[im 4/20]
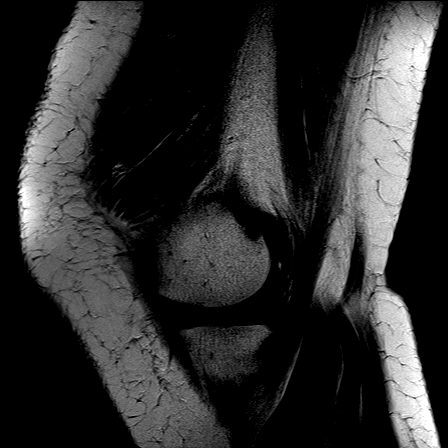
[im 8/20]
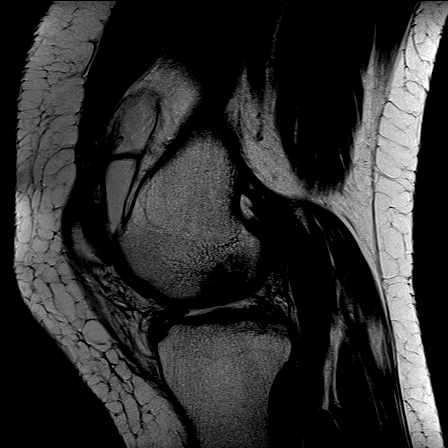
[im 12/20]
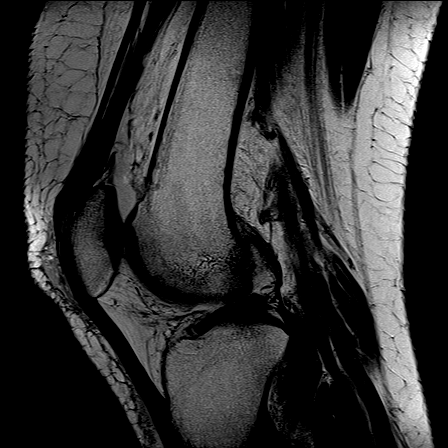
[im 16/20]
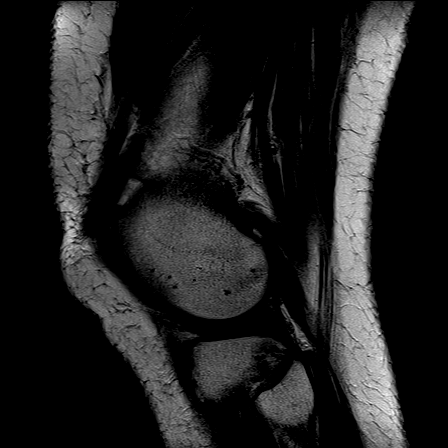
[im 20/20]
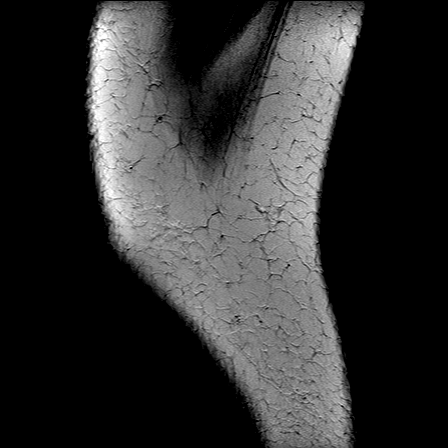

[Series 5: cor dp fat · coronal · left · 3.5mm · 0.62mm/px · 7 of 22 slices shown]
[im 1/22]
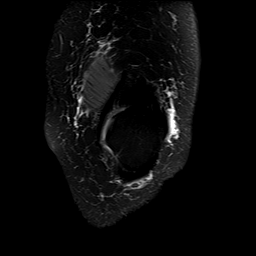
[im 4/22]
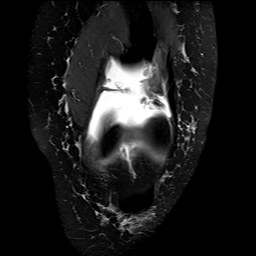
[im 8/22]
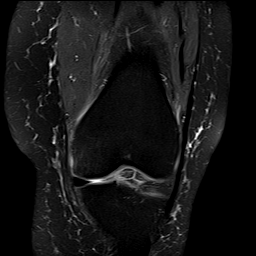
[im 11/22]
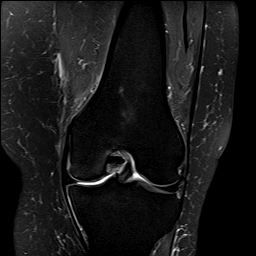
[im 15/22]
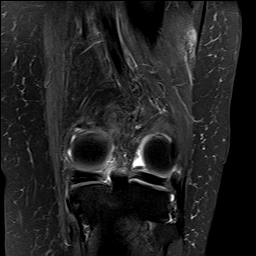
[im 18/22]
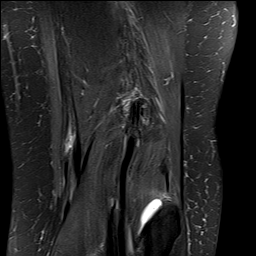
[im 22/22]
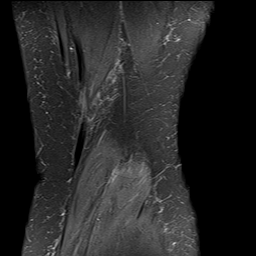

[Series 6: T1 · coronal · left · 3.5mm · 0.50mm/px · 7 of 22 slices shown]
[im 1/22]
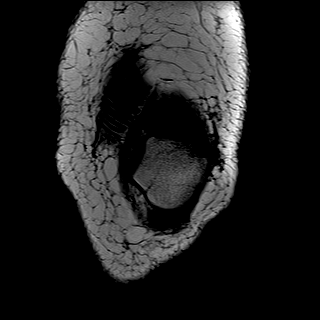
[im 4/22]
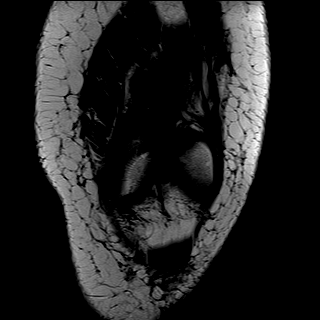
[im 8/22]
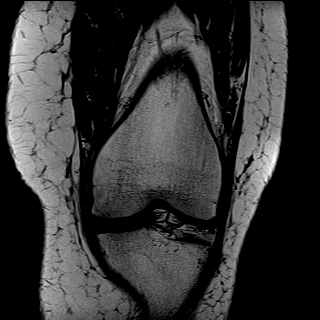
[im 11/22]
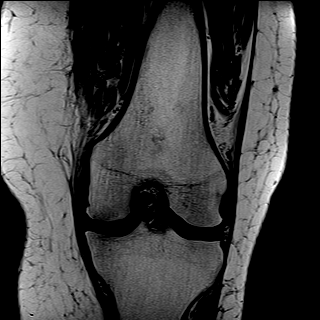
[im 15/22]
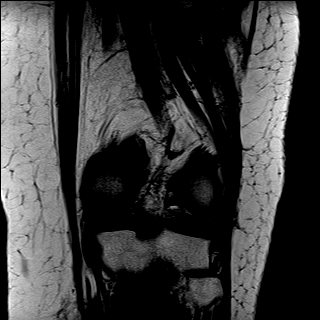
[im 18/22]
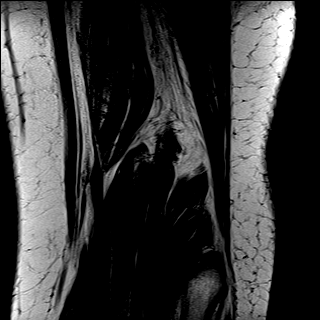
[im 22/22]
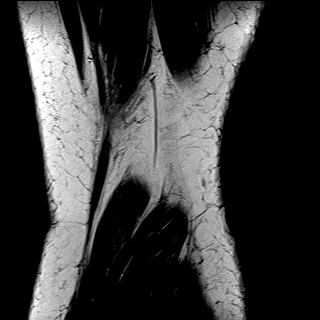

[Series 7: axial dp fat · axial · left · 4.0mm · 0.50mm/px · z∈[-76,+64]mm · 9 of 28 slices shown]
[im 1/28]
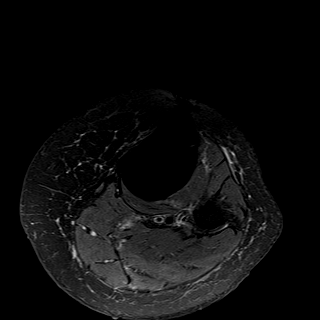
[im 4/28]
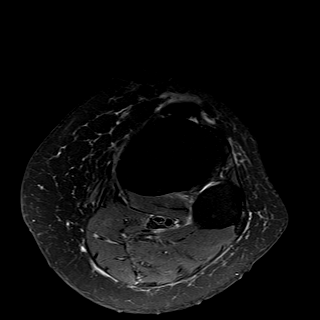
[im 7/28]
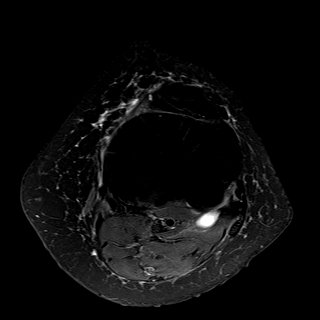
[im 11/28]
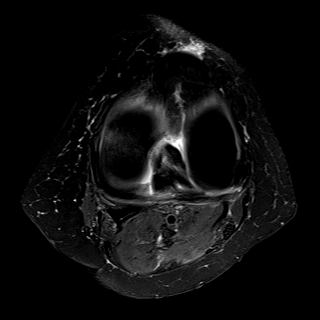
[im 14/28]
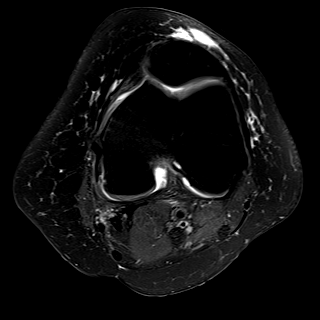
[im 17/28]
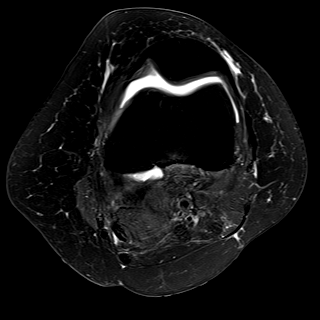
[im 21/28]
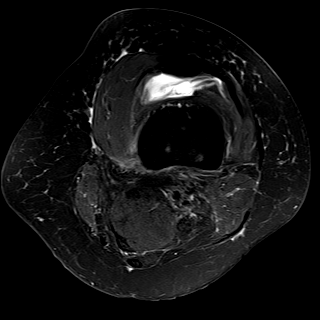
[im 24/28]
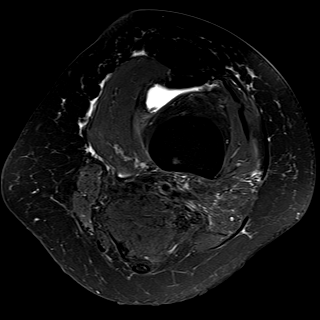
[im 28/28]
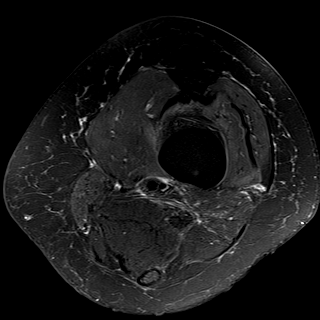

[Series 8: T2 · oblique · left · 2.0mm · 0.50mm/px · 5 of 16 slices shown]
[im 1/16]
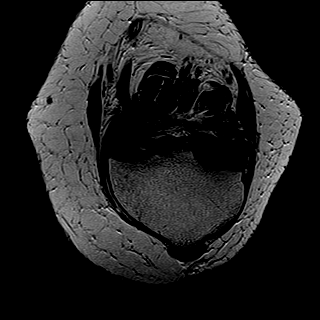
[im 4/16]
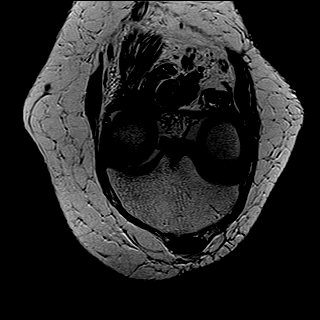
[im 8/16]
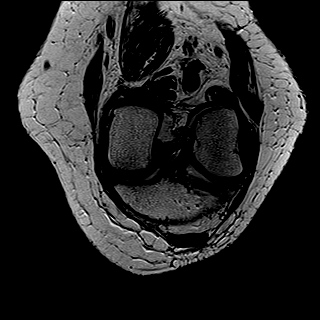
[im 12/16]
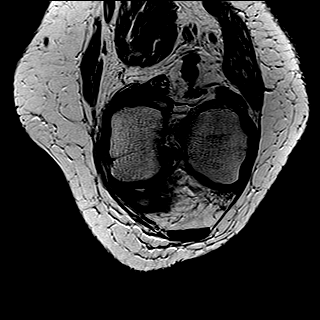
[im 16/16]
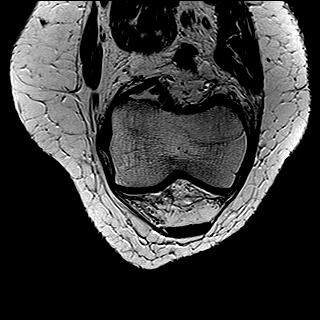

[40 of 40 positions shown; findings below may reference images not displayed]

Técnica de exame:
Aquisições multiplanares em aparelho de 1,5 Tesla, sem contraste venoso.
Aspectos observados:
Pequeno derrame articular associado a edema periarticular.
Cartilagens do compartimento femoropatelar com leve redução de espessura e sinal heterogêneo, havendo erosões
profundas na faceta medial e no sulco troclear acompanhadas de cistos e edema do subcondral.
RESSONÂNCIA MAGNÉTICA DO JOEL[facility] ESQUERDO
Cartilagens do compartimento femorotibial medial com redução moderada da espessura e sinal heterogêneo, havendo
leve edema no osso subcondral.
Cartilagens do compartimento femorotibial lateral preservadas.
Menisco  medial  com  sinal  heterogêneo  associada  à  ruptura  horizontal  da  borda  livre  do  corno  posterior,  havendo
irregularidades na porção interna do corno posterior e da raiz caracterizando lesões.
Menisco lateral íntegro, com morfologia normal e sinal levemente heterogêneo.
Ligamentos cruzados e colaterais íntegros.
Bursa do ligamento colateral medial distendida. Bursa da pata de ganso levemente espessada.
Gordura de [place] com sinal normal. Coxim adiposo suprapatelar preservados. Plicas sinoviais sem anormalidades.
Retináculos delineados.
Tendões  e  porção  distal  do  trato  ílio-tibial  íntegros,  sem  alterações  signiﬁcativas  de  sinal.  Grupos  musculares
eutróﬁcos. Bursas poplítea levemente espessada.
Tela subcutânea contendo edema.
Alê Franca Wagdan Zeya
Opinião:
Pequeno derrame articular associado a edema periarticular.
Condropatia femoropatelar moderada, mais acentuada no componente troclear.
Condropatia leve no compartimento medial.
Ruptura horizontal da borda livre do corno posterior do corno posterior, havendo discretas lesões na porção interna
do corno posterior e da raiz.
Bursite do ligamento colateral medial.
Tenobursite da pata de ganso.
Alê Franca Wagdan Zeya
